# Patient Record
Sex: Male | Born: 1990 | Race: White | Hispanic: No | Marital: Single | Smoking: Never smoker
Health system: Southern US, Community
[De-identification: ages and names within clinical notes are randomized; demographics above are authoritative.]

## PROBLEM LIST (undated history)

## (undated) DIAGNOSIS — J45909 Unspecified asthma, uncomplicated: Secondary | ICD-10-CM

## (undated) DIAGNOSIS — J449 Chronic obstructive pulmonary disease, unspecified: Secondary | ICD-10-CM

## (undated) DIAGNOSIS — M419 Scoliosis, unspecified: Secondary | ICD-10-CM

## (undated) DIAGNOSIS — S62339A Displaced fracture of neck of unspecified metacarpal bone, initial encounter for closed fracture: Secondary | ICD-10-CM

## (undated) DIAGNOSIS — R011 Cardiac murmur, unspecified: Secondary | ICD-10-CM

## (undated) DIAGNOSIS — D649 Anemia, unspecified: Secondary | ICD-10-CM

## (undated) HISTORY — DX: Chronic obstructive pulmonary disease, unspecified: J44.9

## (undated) HISTORY — DX: Unspecified asthma, uncomplicated: J45.909

## (undated) HISTORY — PX: FOOT SURGERY: SHX648

## (undated) HISTORY — PX: LUNG SURGERY: SHX703

## (undated) HISTORY — DX: Anemia, unspecified: D64.9

---
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
## 2017-05-18 ENCOUNTER — Encounter (HOSPITAL_COMMUNITY): Payer: Self-pay | Admitting: Family Medicine

## 2017-05-18 DIAGNOSIS — R531 Weakness: Secondary | ICD-10-CM | POA: Insufficient documentation

## 2017-05-18 DIAGNOSIS — F1210 Cannabis abuse, uncomplicated: Secondary | ICD-10-CM | POA: Insufficient documentation

## 2017-05-18 DIAGNOSIS — G5601 Carpal tunnel syndrome, right upper limb: Secondary | ICD-10-CM | POA: Insufficient documentation

## 2017-05-18 DIAGNOSIS — R200 Anesthesia of skin: Secondary | ICD-10-CM | POA: Insufficient documentation

## 2017-05-18 NOTE — ED Triage Notes (Signed)
Patient reports he has been experiencing right numbness starting in his finger tips that radiates to his elbow/shoulder. Symptoms started 3 weeks ago but got worse in the last 2 days. Patient was [facility] about 1-2 weeks ago for same symptoms. Strong radial pulse and able to move fingers on the right hand.

## 2017-05-19 ENCOUNTER — Emergency Department (HOSPITAL_COMMUNITY)
Admission: EM | Admit: 2017-05-19 | Discharge: 2017-05-19 | Disposition: A | Discharge status: Home / Self Care | Payer: Self-pay | Attending: Emergency Medicine | Admitting: Emergency Medicine

## 2017-05-19 DIAGNOSIS — G5601 Carpal tunnel syndrome, right upper limb: Secondary | ICD-10-CM

## 2017-05-19 HISTORY — DX: Cardiac murmur, unspecified: R01.1

## 2017-05-19 HISTORY — DX: Scoliosis, unspecified: M41.9

## 2017-05-19 HISTORY — DX: Displaced fracture of neck of unspecified metacarpal bone, initial encounter for closed fracture: S62.339A

## 2017-05-19 MED ORDER — IBUPROFEN 600 MG PO TABS: 600.0000 mg | ORAL_TABLET | Freq: Four times a day (QID) | ORAL | 0 refills | Status: DC | PRN

## 2017-05-19 NOTE — ED Provider Notes (Signed)
[facility]-EMERGENCY DEPT Provider Note   CSN: [id number] Arrival date & time: 05/18/17  2147     History   Chief Complaint Chief Complaint  Patient presents with  . Numbness    HPI Ivan Lowe is a 27 y.o. male.  Patient presents to the ED with a chief complaint of right hand pain.  He states that he has had numbness and weakness in his right hand that has been gradually worsening for the past several weeks.  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  He denies any other symptoms.  The history is provided by the patient. No language interpreter was used.    Past Medical History:  Diagnosis Date  . Boxer's fracture   . Heart murmur   . Scoliosis     There are no active problems to display for this patient.   Past Surgical History:  Procedure Laterality Date  . FOOT SURGERY          Home Medications    Prior to Admission medications   Medication Sig Start Date End Date Taking? Authorizing Provider  ibuprofen (ADVIL,MOTRIN) 600 MG tablet Take 1 tablet (600 mg total) by mouth every 6 (six) hours as needed. 05/19/17   Roxy Horseman, PA-C    Family History History reviewed. No pertinent family history.  Social History Social History   Tobacco Use  . Smoking status: Never Smoker  . Smokeless tobacco: Never Used  Substance Use Topics  . Alcohol use: Yes    Comment: Once or twice a month   . Drug use: Yes    Types: Marijuana    Comment: Last used: 2 weeks ago      Allergies   Flonase [fluticasone propionate]   Review of Systems Review of Systems  All other systems reviewed and are negative.    Physical Exam Updated Vital Signs BP (!) 153/114   Pulse 79   Temp 98.2 F (36.8 C) (Oral)   Resp 17   Ht 5\' 7"  (1.702 m)   Wt 97.1 kg (214 lb)   SpO2 99%   BMI 33.52 kg/m   Physical Exam Nursing note and vitals  reviewed.  Constitutional: Pt appears well-developed and well-nourished. No distress.  HENT:  Head: Normocephalic and atraumatic.  Eyes: Conjunctivae are normal.  Neck: Normal range of motion.  Cardiovascular: Normal rate, regular rhythm. Intact distal pulses.   Capillary refill < 3 sec.  Pulmonary/Chest: Effort normal and breath sounds normal.  Musculoskeletal:  Right wrist Pt exhibits positive tinel and phalen tests, no bony abnormality or deformity.   ROM: 5/5  Strength: 4/5  Neurological: Pt  is alert. Coordination normal.  Sensation: 5/5 Skin: Skin is warm and dry. Pt is not diaphoretic.  No evidence of open wound or skin tenting Psychiatric: Pt has a normal mood and affect.     ED Treatments / Results  Labs (all labs ordered are listed, but only abnormal results are displayed) Labs Reviewed - No data to display  EKG None  Radiology No results found.  Procedures Procedures (including critical care time)  Medications Ordered in ED Medications - No data to display   Initial Impression / Assessment and Plan / ED Course  I have reviewed the triage vital signs and the nursing notes.  Pertinent labs & imaging results that were available during my care of the patient were reviewed by me and considered  in my medical decision making (see chart for details).     Patient with hx and physical consistent with carpal tunnels syndrome.  Will give velcro cock up splint and recommend hand follow-up.  Final Clinical Impressions(s) / ED Diagnoses   Final diagnoses:  Carpal tunnel syndrome of right wrist    ED Discharge Orders        Ordered    ibuprofen (ADVIL,MOTRIN) 600 MG tablet  Every 6 hours PRN     05/19/17 0401       Roxy HorsemanBrowning, Leston Schueller, PA-C 05/19/17 0410    Dione BoozeGlick, David, MD 05/19/17 [phone]

## 2019-08-30 ENCOUNTER — Encounter: Payer: Self-pay | Admitting: Cardiology

## 2019-08-30 ENCOUNTER — Ambulatory Visit (INDEPENDENT_AMBULATORY_CARE_PROVIDER_SITE_OTHER): Payer: BLUE CROSS/BLUE SHIELD | Admitting: Cardiology

## 2019-08-30 ENCOUNTER — Ambulatory Visit (INDEPENDENT_AMBULATORY_CARE_PROVIDER_SITE_OTHER): Payer: BLUE CROSS/BLUE SHIELD

## 2019-08-30 ENCOUNTER — Other Ambulatory Visit: Payer: Self-pay

## 2019-08-30 VITALS — BP 126/88 | HR 86 | Ht 67.0 in | Wt 220.0 lb

## 2019-08-30 DIAGNOSIS — E669 Obesity, unspecified: Secondary | ICD-10-CM

## 2019-08-30 DIAGNOSIS — R0602 Shortness of breath: Secondary | ICD-10-CM

## 2019-08-30 DIAGNOSIS — R55 Syncope and collapse: Secondary | ICD-10-CM

## 2019-08-30 DIAGNOSIS — G43109 Migraine with aura, not intractable, without status migrainosus: Secondary | ICD-10-CM

## 2019-08-30 DIAGNOSIS — E782 Mixed hyperlipidemia: Secondary | ICD-10-CM

## 2019-08-30 DIAGNOSIS — R002 Palpitations: Secondary | ICD-10-CM

## 2019-08-30 HISTORY — DX: Syncope and collapse: R55

## 2019-08-30 HISTORY — DX: Shortness of breath: R06.02

## 2019-08-30 HISTORY — DX: Palpitations: R00.2

## 2019-08-30 HISTORY — DX: Mixed hyperlipidemia: E78.2

## 2019-08-30 HISTORY — DX: Migraine with aura, not intractable, without status migrainosus: G43.109

## 2019-08-30 HISTORY — DX: Obesity, unspecified: E66.9

## 2019-08-30 NOTE — Patient Instructions (Signed)
Medication Instructions:  No medication changes. *If you need a refill on your cardiac medications before your next appointment, please call your pharmacy*   Lab Work: None ordered If you have labs (blood work) drawn today and your tests are completely normal, you will receive your results only by: Marland Kitchen MyChart Message (if you have MyChart) OR . A paper copy in the mail If you have any lab test that is abnormal or we need to change your treatment, we will call you to review the results.   Testing/Procedures: Your physician has requested that you have an echocardiogram. Echocardiography is a painless test that uses sound waves to create images of your heart. It provides your doctor with information about the size and shape of your heart and how well your heart's chambers and valves are working. This procedure takes approximately one hour. There are no restrictions for this procedure.   WHY IS MY DOCTOR PRESCRIBING ZIO? The Zio system is proven and trusted by physicians to detect and diagnose irregular heart rhythms -- and has been prescribed to hundreds of thousands of patients.  The FDA has cleared the Zio system to monitor for many different kinds of irregular heart rhythms. In a study, physicians were able to reach a diagnosis 90% of the time with the Zio system1.  You can wear the Zio monitor -- a small, discreet, comfortable patch -- during your normal day-to-day activity, including while you sleep, shower, and exercise, while it records every single heartbeat for analysis.  1Barrett, P., et al. Comparison of 24 Hour Holter Monitoring Versus 14 Day Novel Adhesive Patch Electrocardiographic Monitoring. American Journal of Medicine, 2014.  ZIO VS. HOLTER MONITORING The Zio monitor can be comfortably worn for up to 14 days. Holter monitors can be worn for 24 to 48 hours, limiting the time to record any irregular heart rhythms you may have. Zio is able to capture data for the 51% of patients  who have their first symptom-triggered arrhythmia after 48 hours.1  LIVE WITHOUT RESTRICTIONS The Zio ambulatory cardiac monitor is a small, unobtrusive, and water-resistant patch--you might even forget you're wearing it. The Zio monitor records and stores every beat of your heart, whether you're sleeping, working out, or showering.  Wear the monitor for 2 weeks, remove on 09/13/19.   Follow-Up: At [facility], you and your health needs are our priority.  As part of our continuing mission to provide you with exceptional heart care, we have created designated Provider Care Teams.  These Care Teams include your primary Cardiologist (physician) and Advanced Practice Providers (APPs -  Physician Assistants and Nurse Practitioners) who all work together to provide you with the care you need, when you need it.  We recommend signing up for the patient portal called "MyChart".  Sign up information is provided on this After Visit Summary.  MyChart is used to connect with patients for Virtual Visits (Telemedicine).  Patients are able to view lab/test results, encounter notes, upcoming appointments, etc.  Non-urgent messages can be sent to your provider as well.   To learn more about what you can do with MyChart, go to [email or web address].    Your next appointment:   3 month(s)  The format for your next appointment:   In Person  Provider:   Thomasene Ripple, DO   Other Instructions We have placed a referral to neurology and pulmonology. Echocardiogram An echocardiogram is a procedure that uses painless sound waves (ultrasound) to produce an image of the heart. Images from an  echocardiogram can provide important information about:  Signs of coronary artery disease (CAD).  Aneurysm detection. An aneurysm is a weak or damaged part of an artery wall that bulges out from the normal force of blood pumping through the body.  Heart size and shape. Changes in the size or shape of the heart can be  associated with certain conditions, including heart failure, aneurysm, and CAD.  Heart muscle function.  Heart valve function.  Signs of a past heart attack.  Fluid buildup around the heart.  Thickening of the heart muscle.  A tumor or infectious growth around the heart valves. Tell a health care provider about:  Any allergies you have.  All medicines you are taking, including vitamins, herbs, eye drops, creams, and over-the-counter medicines.  Any blood disorders you have.  Any surgeries you have had.  Any medical conditions you have.  Whether you are pregnant or may be pregnant. What are the risks? Generally, this is a safe procedure. However, problems may occur, including:  Allergic reaction to dye (contrast) that may be used during the procedure. What happens before the procedure? No specific preparation is needed. You may eat and drink normally. What happens during the procedure?   An IV tube may be inserted into one of your veins.  You may receive contrast through this tube. A contrast is an injection that improves the quality of the pictures from your heart.  A gel will be applied to your chest.  A wand-like tool (transducer) will be moved over your chest. The gel will help to transmit the sound waves from the transducer.  The sound waves will harmlessly bounce off of your heart to allow the heart images to be captured in real-time motion. The images will be recorded on a computer. The procedure may vary among health care providers and hospitals. What happens after the procedure?  You may return to your normal, everyday life, including diet, activities, and medicines, unless your health care provider tells you not to do that. Summary  An echocardiogram is a procedure that uses painless sound waves (ultrasound) to produce an image of the heart.  Images from an echocardiogram can provide important information about the size and shape of your heart, heart  muscle function, heart valve function, and fluid buildup around your heart.  You do not need to do anything to prepare before this procedure. You may eat and drink normally.  After the echocardiogram is completed, you may return to your normal, everyday life, unless your health care provider tells you not to do that. This information is not intended to replace advice given to you by your health care provider. Make sure you discuss any questions you have with your health care provider. Document Revised: 05/25/2018 Document Reviewed: 03/06/2016 Elsevier Patient Education  2020 [employer].

## 2019-08-30 NOTE — Progress Notes (Signed)
Cardiology Office Note:    Date:  08/30/2019   ID:  Mayford Knife, DOB [birth year], MRN [id number]  PCP:  Reita May, NP  Cardiologist:  No primary care provider on file.  Electrophysiologist:  None   Referring MD: Reita May, NP    " I have had some passion out spells and shortness of breath"  History of Present Illness:    Ivan Lowe is a 29 y.o. 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    The patient tells me recently he has been experiencing multiple episodes of syncope events.  He described it as sometimes when he is standing or sitting doing his activity he experienced significant blurry vision with lightheadedness.  Then he gets extreme headache and become sweaty.  He notes that he has to gradually sit down or else he feels that he is going to pass out.  Once the headaches come on denies for close to 15 to 20 minutes.  At times he has to turn out the lights in his apartment or in his room and lay down quietly without any noise eventually this feels better.  In addition he does have some shortness of breath.  With the shortness of breath he feels that he gets tightening of his chest.  He does have known asthma he is not taking any inhalers as well.  Given his symptoms he was asked to see cardiology.  He tells me nothing makes this better or worse.    Patient tells me that his biggest fear is that his health is going to deteriorate as young as he is and he is not going to be able to do his activities including work.  In addition he notes that he gets some palpitations which he describes abrupt onset of fast heartbeat which last for few minutes prior to resolution.  Past Medical History:  Diagnosis Date  . Boxer's fracture   . Heart murmur   . Scoliosis     Past  Surgical History:  Procedure Laterality Date  . FOOT SURGERY      Current Medications: Current Meds  Medication Sig  . celecoxib (CELEBREX) 200 MG capsule Take 200 mg by mouth as needed.  Marland Kitchen ibuprofen (ADVIL,MOTRIN) 600 MG tablet Take 1 tablet (600 mg total) by mouth every 6 (six) hours as needed.  . metaxalone (SKELAXIN) 800 MG tablet Take 800 mg by mouth 3 (three) times daily as needed.  . promethazine (PHENERGAN) 25 MG tablet Take 25 mg by mouth every 6 (six) hours as needed for nausea or vomiting.  . SUMAtriptan (IMITREX) 50 MG tablet Take 50 mg by mouth every 2 (two) hours as needed for migraine. May repeat in 2 hours if headache persists or recurs.     Allergies:   Flonase [fluticasone propionate]   Social History   Socioeconomic History  . Marital status: Single    Spouse name: Not on file  . Number of children: Not on file  . Years of education: Not on file  . Highest education level: Not on file  Occupational History  . Not on file  Tobacco Use  . Smoking status: Never Smoker  . Smokeless tobacco: Never Used  Vaping Use  . Vaping Use: Never used  Substance and Sexual Activity  . Alcohol use: Yes  Comment: Once or twice a month   . Drug use: Yes    Types: Marijuana    Comment: Last used: 2 weeks ago   . Sexual activity: Not on file  Other Topics Concern  . Not on file  Social History Narrative  . Not on file   Social Determinants of Health   Financial Resource Strain:   . Difficulty of Paying Living Expenses:   Food Insecurity:   . Worried About Charity fundraiser in the Last Year:   . Arboriculturist in the Last Year:   Transportation Needs:   . Film/video editor (Medical):   Marland Kitchen Lack of Transportation (Non-Medical):   Physical Activity:   . Days of Exercise per Week:   . Minutes of Exercise per Session:   Stress:   . Feeling of Stress :   Social Connections:   . Frequency of Communication with Friends and Family:   . Frequency of Social  Gatherings with Friends and Family:   . Attends Religious Services:   . Active Member of Clubs or Organizations:   . Attends Archivist Meetings:   Marland Kitchen Marital Status:      Family History: The patient's family history includes Heart disease in his maternal grandmother and mother; Hypertension in his mother.  ROS:   Review of Systems  Constitution: Negative for decreased appetite, fever and weight gain.  HENT: Negative for congestion, ear discharge, hoarse voice and sore throat.   Eyes: Negative for discharge, redness, vision loss in right eye and visual halos.  Cardiovascular: Reports chest tightness, shortness of breath and palpitations.  Negative for leg swelling, orthopnea. Respiratory: Negative for cough, hemoptysis, shortness of breath and snoring.   Endocrine: Negative for heat intolerance and polyphagia.  Hematologic/Lymphatic: Negative for bleeding problem. Does not bruise/bleed easily.  Skin: Negative for flushing, nail changes, rash and suspicious lesions.  Musculoskeletal: Negative for arthritis, joint pain, muscle cramps, myalgias, neck pain and stiffness.  Gastrointestinal: Negative for abdominal pain, bowel incontinence, diarrhea and excessive appetite.  Genitourinary: Negative for decreased libido, genital sores and incomplete emptying.  Neurological: Negative for brief paralysis, focal weakness, headaches and loss of balance.  Psychiatric/Behavioral: Negative for altered mental status, depression and suicidal ideas.  Allergic/Immunologic: Negative for HIV exposure and persistent infections.    EKGs/Labs/Other Studies Reviewed:    The following studies were reviewed today:   EKG:  The ekg ordered today demonstrates sinus rhythm, heart rate 75 bpm nonspecific ST changes LVH by aVL criteria.  Recent Labs: Labs done on August 22, 2019 by his PCP: WBC 8.0, hemoglobin 16.1, hematocrit 48.6, platelet 354 Chemistry: Glucose 101, BUN 9, creatinine 1.10, sodium 140,  potassium 4.5, chloride 102, bicarb 25, calcium 10.3, total protein 7.2, albumin 4.5, total globulin 2.7, total bili 0.3, alk phos 71, AST 13, ALT 20 TSH 1.66 Recent Lipid Panel Lipid profile done in March 2021 total cholesterol 207, triglyceride 102, HDL 49, LDL 140  Physical Exam:    VS:  BP 126/88 (BP Location: Left Arm, Patient Position: Sitting, Cuff Size: Normal)   Pulse 86   Ht '5\' 7"'  (1.702 m)   Wt 220 lb (99.8 kg)   SpO2 98%   BMI 34.46 kg/m     Wt Readings from Last 3 Encounters:  08/30/19 220 lb (99.8 kg)  05/18/17 214 lb (97.1 kg)     GEN: Well nourished, well developed in no acute distress HEENT: Normal NECK: No JVD; No carotid bruits LYMPHATICS: No  lymphadenopathy CARDIAC: S1S2 noted,RRR, no murmurs, rubs, gallops RESPIRATORY:  Clear to auscultation without rales, wheezing or rhonchi  ABDOMEN: Soft, non-tender, non-distended, +bowel sounds, no guarding. EXTREMITIES: No edema, No cyanosis, no clubbing MUSCULOSKELETAL:  No deformity  SKIN: Warm and dry NEUROLOGIC:  Alert and oriented x 3, non-focal PSYCHIATRIC:  Normal affect, good insight  ASSESSMENT:    1. Syncope and collapse   2. Palpitations   3. Shortness of breath   4. Complicated migraine   5. Mixed hyperlipidemia   6. Obesity (BMI 30-39.9)    PLAN:    His symptoms are concerning.  I would like to rule out a cardiovascular etiology of this presyncope and palpitation, therefore at this time I would like to placed a zio patch for 14 days. In additon a transthoracic echocardiogram will be ordered to assess LV/RV function and any structural abnormalities.  I will have his echocardiogram be with bubble study given the concern for complicated migraine to make sure that there is no PFO.  I do think that complicated migraine headaches may be an issue here.  I have also asked the patient to see neurology to hopefully help him with medical treatment for this.  Given his significant long history with lung  surgery as a child, asthma that is not being treated and his recent shortness of breath that is worsening it is appropriate for the patient to see pulmonary to at least get a pulmonary function tests and hopefully get back on a rescue inhaler.  Obesity-the patient understands the need to lose weight with diet and exercise. We have discussed specific strategies for this.   Hyperlipidemia recent LDL was 140.  Patient prefers diet modification for now.  I did discuss the [place] DMV medical guidelines for driving: "it is prudent to recommend that all persons should be free of syncopal episodes for at least six months to be granted the driving privilege." ([place], Second Edition, Medical Review Branch, Engineer, site, Division of [employer], [employer], July 2004)  Once these testing have been performed and reviewed further reccomendations will be made. For now, I do reccomend that the patient goes to the nearest ED if  symptoms recur.   The patient is in agreement with the above plan. The patient left the office in stable condition.  The patient will follow up in 3 months or sooner if needed.   Medication Adjustments/Labs and Tests Ordered: Current medicines are reviewed at length with the patient today.  Concerns regarding medicines are outlined above.  Orders Placed This Encounter  Procedures  . Ambulatory referral to Pulmonology  . Ambulatory referral to Neurology  . LONG TERM MONITOR (3-14 DAYS)  . EKG 12-Lead  . ECHOCARDIOGRAM COMPLETE BUBBLE STUDY   No orders of the defined types were placed in this encounter.   Patient Instructions  Medication Instructions:  No medication changes. *If you need a refill on your cardiac medications before your next appointment, please call your pharmacy*   Lab Work: None ordered If you have labs (blood work) drawn today and your tests are  completely normal, you will receive your results only by: Marland Kitchen MyChart Message (if you have MyChart) OR . A paper copy in the mail If you have any lab test that is abnormal or we need to change your treatment, we will call you to review the results.   Testing/Procedures: Your physician has requested that you have an echocardiogram. Echocardiography  is a painless test that uses sound waves to create images of your heart. It provides your doctor with information about the size and shape of your heart and how well your heart's chambers and valves are working. This procedure takes approximately one hour. There are no restrictions for this procedure.   WHY IS MY DOCTOR PRESCRIBING ZIO? The Zio system is proven and trusted by physicians to detect and diagnose irregular heart rhythms -- and has been prescribed to hundreds of thousands of patients.  The FDA has cleared the Zio system to monitor for many different kinds of irregular heart rhythms. In a study, physicians were able to reach a diagnosis 90% of the time with the Zio system1.  You can wear the Zio monitor -- a small, discreet, comfortable patch -- during your normal day-to-day activity, including while you sleep, shower, and exercise, while it records every single heartbeat for analysis.  1Barrett, P., et al. Comparison of 24 Hour Holter Monitoring Versus 14 Day Novel Adhesive Patch Electrocardiographic Monitoring. [place], 2014.  ZIO VS. HOLTER MONITORING The Zio monitor can be comfortably worn for up to 14 days. Holter monitors can be worn for 24 to 48 hours, limiting the time to record any irregular heart rhythms you may have. Zio is able to capture data for the 51% of patients who have their first symptom-triggered arrhythmia after 48 hours.1  LIVE WITHOUT RESTRICTIONS The Zio ambulatory cardiac monitor is a small, unobtrusive, and water-resistant patch--you might even forget you're wearing it. The Zio monitor records  and stores every beat of your heart, whether you're sleeping, working out, or showering.  Wear the monitor for 2 weeks, remove on 09/13/19.   Follow-Up: At [facility], you and your health needs are our priority.  As part of our continuing mission to provide you with exceptional heart care, we have created designated Provider Care Teams.  These Care Teams include your primary Cardiologist (physician) and Advanced Practice Providers (APPs -  Physician Assistants and Nurse Practitioners) who all work together to provide you with the care you need, when you need it.  We recommend signing up for the patient portal called "MyChart".  Sign up information is provided on this After Visit Summary.  MyChart is used to connect with patients for Virtual Visits (Telemedicine).  Patients are able to view lab/test results, encounter notes, upcoming appointments, etc.  Non-urgent messages can be sent to your provider as well.   To learn more about what you can do with MyChart, go to [email or web address].    Your next appointment:   3 month(s)  The format for your next appointment:   In Person  Provider:   Berniece Salines, DO   Other Instructions We have placed a referral to neurology and pulmonology. Echocardiogram An echocardiogram is a procedure that uses painless sound waves (ultrasound) to produce an image of the heart. Images from an echocardiogram can provide important information about:  Signs of coronary artery disease (CAD).  Aneurysm detection. An aneurysm is a weak or damaged part of an artery wall that bulges out from the normal force of blood pumping through the body.  Heart size and shape. Changes in the size or shape of the heart can be associated with certain conditions, including heart failure, aneurysm, and CAD.  Heart muscle function.  Heart valve function.  Signs of a past heart attack.  Fluid buildup around the heart.  Thickening of the heart muscle.  A tumor or  infectious growth around  the heart valves. Tell a health care provider about:  Any allergies you have.  All medicines you are taking, including vitamins, herbs, eye drops, creams, and over-the-counter medicines.  Any blood disorders you have.  Any surgeries you have had.  Any medical conditions you have.  Whether you are pregnant or may be pregnant. What are the risks? Generally, this is a safe procedure. However, problems may occur, including:  Allergic reaction to dye (contrast) that may be used during the procedure. What happens before the procedure? No specific preparation is needed. You may eat and drink normally. What happens during the procedure?   An IV tube may be inserted into one of your veins.  You may receive contrast through this tube. A contrast is an injection that improves the quality of the pictures from your heart.  A gel will be applied to your chest.  A wand-like tool (transducer) will be moved over your chest. The gel will help to transmit the sound waves from the transducer.  The sound waves will harmlessly bounce off of your heart to allow the heart images to be captured in real-time motion. The images will be recorded on a computer. The procedure may vary among health care providers and hospitals. What happens after the procedure?  You may return to your normal, everyday life, including diet, activities, and medicines, unless your health care provider tells you not to do that. Summary  An echocardiogram is a procedure that uses painless sound waves (ultrasound) to produce an image of the heart.  Images from an echocardiogram can provide important information about the size and shape of your heart, heart muscle function, heart valve function, and fluid buildup around your heart.  You do not need to do anything to prepare before this procedure. You may eat and drink normally.  After the echocardiogram is completed, you may return to your normal,  everyday life, unless your health care provider tells you not to do that. This information is not intended to replace advice given to you by your health care provider. Make sure you discuss any questions you have with your health care provider. Document Revised: 05/25/2018 Document Reviewed: 03/06/2016 Elsevier Patient Education  [place].      Adopting a Healthy Lifestyle.  Know what a healthy weight is for you (roughly BMI <25) and aim to maintain this   Aim for 7+ servings of fruits and vegetables daily   65-80+ fluid ounces of water or unsweet tea for healthy kidneys   Limit to max 1 drink of alcohol per day; avoid smoking/tobacco   Limit animal fats in diet for cholesterol and heart health - choose grass fed whenever available   Avoid highly processed foods, and foods high in saturated/trans fats   Aim for low stress - take time to unwind and care for your mental health   Aim for 150 min of moderate intensity exercise weekly for heart health, and weights twice weekly for bone health   Aim for 7-9 hours of sleep daily   When it comes to diets, agreement about the perfect plan isnt easy to find, even among the experts. Experts at the [place] developed an idea known as the Healthy Eating Plate. Just imagine a plate divided into logical, healthy portions.   The emphasis is on diet quality:   Load up on vegetables and fruits - one-half of your plate: Aim for color and variety, and remember that potatoes dont count.   Go  for whole grains - one-quarter of your plate: Whole wheat, barley, wheat berries, quinoa, oats, brown rice, and foods made with them. If you want pasta, go with whole wheat pasta.   Protein power - one-quarter of your plate: Fish, chicken, beans, and nuts are all healthy, versatile protein sources. Limit red meat.   The diet, however, does go beyond the plate, offering a few other suggestions.   Use healthy plant oils, such  as olive, canola, soy, corn, sunflower and peanut. Check the labels, and avoid partially hydrogenated oil, which have unhealthy trans fats.   If youre thirsty, drink water. Coffee and tea are good in moderation, but skip sugary drinks and limit milk and dairy products to one or two daily servings.   The type of carbohydrate in the diet is more important than the amount. Some sources of carbohydrates, such as vegetables, fruits, whole grains, and beans-are healthier than others.   Finally, stay active  Signed, Berniece Salines, DO  08/30/2019 10:12 AM    [place]

## 2019-09-03 ENCOUNTER — Encounter: Payer: Self-pay | Admitting: Neurology

## 2019-09-20 ENCOUNTER — Ambulatory Visit (INDEPENDENT_AMBULATORY_CARE_PROVIDER_SITE_OTHER): Payer: BLUE CROSS/BLUE SHIELD

## 2019-09-20 ENCOUNTER — Other Ambulatory Visit: Payer: Self-pay

## 2019-09-20 DIAGNOSIS — G43109 Migraine with aura, not intractable, without status migrainosus: Secondary | ICD-10-CM | POA: Diagnosis not present

## 2019-09-20 DIAGNOSIS — R0602 Shortness of breath: Secondary | ICD-10-CM

## 2019-09-20 LAB — ECHOCARDIOGRAM COMPLETE BUBBLE STUDY
Area-P 1/2: 4.89 cm2
S' Lateral: 3.2 cm

## 2019-09-20 NOTE — Progress Notes (Addendum)
Complete echocardiogram with bubble study performed.  Jimmy Joahan Swatzell RDCS, RVT  

## 2019-09-21 ENCOUNTER — Telehealth: Payer: Self-pay

## 2019-09-21 NOTE — Telephone Encounter (Signed)
Patient is returning call.

## 2019-09-21 NOTE — Telephone Encounter (Signed)
-----   Message from Thomasene Ripple, DO sent at 09/21/2019  8:16 AM EDT ----- Good news normal echo

## 2019-09-21 NOTE — Telephone Encounter (Signed)
Pt aware ./cy 

## 2019-09-21 NOTE — Telephone Encounter (Signed)
Tried calling patient. No answer and no voicemail set up for me to leave a message. 

## 2019-10-10 ENCOUNTER — Telehealth: Payer: Self-pay | Admitting: Cardiology

## 2019-10-10 DIAGNOSIS — R55 Syncope and collapse: Secondary | ICD-10-CM

## 2019-10-10 NOTE — Telephone Encounter (Signed)
    Pt calling back to follow up Dr. Mallory Shirk referral to see DR. Camnitz. He said he spoke with Dr. Servando Salina on 08/21 to give his heart monitor result but he not heard anything back from it. No referral on file yet on file.

## 2019-10-15 ENCOUNTER — Encounter: Payer: Self-pay | Admitting: Cardiology

## 2019-10-15 ENCOUNTER — Ambulatory Visit (INDEPENDENT_AMBULATORY_CARE_PROVIDER_SITE_OTHER): Payer: BLUE CROSS/BLUE SHIELD | Admitting: Cardiology

## 2019-10-15 ENCOUNTER — Other Ambulatory Visit: Payer: Self-pay

## 2019-10-15 VITALS — BP 116/90 | HR 77 | Ht 67.0 in | Wt 223.0 lb

## 2019-10-15 DIAGNOSIS — R55 Syncope and collapse: Secondary | ICD-10-CM

## 2019-10-15 NOTE — Patient Instructions (Signed)
Medication Instructions:  Your physician recommends that you continue on your current medications as directed. Please refer to the Current Medication list given to you today.  *If you need a refill on your cardiac medications before your next appointment, please call your pharmacy*   Lab Work: None ordered If you have labs (blood work) drawn today and your tests are completely normal, you will receive your results only by: Marland Kitchen MyChart Message (if you have MyChart) OR . A paper copy in the mail If you have any lab test that is abnormal or we need to change your treatment, we will call you to review the results.   Testing/Procedures: None ordered   Follow-Up: At [facility], you and your health needs are our priority.  As part of our continuing mission to provide you with exceptional heart care, we have created designated Provider Care Teams.  These Care Teams include your primary Cardiologist (physician) and Advanced Practice Providers (APPs -  Physician Assistants and Nurse Practitioners) who all work together to provide you with the care you need, when you need it.  We recommend signing up for the patient portal called "MyChart".  Sign up information is provided on this After Visit Summary.  MyChart is used to connect with patients for Virtual Visits (Telemedicine).  Patients are able to view lab/test results, encounter notes, upcoming appointments, etc.  Non-urgent messages can be sent to your provider as well.   To learn more about what you can do with MyChart, go to [email or web address].    Your next appointment:    As needed  The format for your next appointment:   In Person  Provider:   Loman Brooklyn, MD   Thank you for choosing [facility]!!   Dory Horn, RN [phone]    Other Instructions

## 2019-10-15 NOTE — Progress Notes (Signed)
Electrophysiology Office Note   Date:  10/15/2019   ID:  [name], [name] 04/10/1990, MRN [id number]  PCP:  Yvonne Kendall, NP  Cardiologist:  Servando Salina Primary Electrophysiologist:  Amyah Clawson Jorja Loa, MD    Chief Complaint: palpitations   History of Present Illness: Ivan Lowe is a 29 y.o. male who is being seen today for the evaluation of palpitations at the request of Tobb, Kardie, DO. Presenting today for electrophysiology evaluation.  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  He has been having multiple episodes of syncope.  These mostly occur when standing or sitting.  He gets blurry vision with lightheadedness.  He also has an extreme headache and gets sweaty.  He has to gradually sit down or else he feels that he Jeanette Moffatt pass out.  This started a few weeks ago.  Prior to that he had done well.  He does work in Scientist, water quality, but he says that it is quite well ventilated.  Today, he denies symptoms of palpitations, chest pain, shortness of breath, orthopnea, PND, lower extremity edema, claudication, dizziness, presyncope, syncope, bleeding, or neurologic sequela. The patient is tolerating medications without difficulties.    Past Medical History:  Diagnosis Date  . Boxer's fracture   . Heart murmur   . Scoliosis    Past Surgical History:  Procedure Laterality Date  . FOOT SURGERY       Current Outpatient Medications  Medication Sig Dispense Refill  . celecoxib (CELEBREX) 200 MG capsule Take 200 mg by mouth as needed.    Marland Kitchen ibuprofen (ADVIL,MOTRIN) 600 MG tablet Take 1 tablet (600 mg total) by mouth every 6 (six) hours as needed. 30 tablet 0  . SUMAtriptan (IMITREX) 50 MG tablet Take 50 mg by mouth every 2 (two) hours as needed for migraine. May repeat in 2 hours if headache persists or recurs.     No current facility-administered medications for this visit.     Allergies:   Flonase [fluticasone propionate]   Social History:  The patient  reports that he has never smoked. He has never used smokeless tobacco. He reports current alcohol use. He reports current drug use. Drug: Marijuana.   Family History:  The patient's family history includes Heart disease in his maternal grandmother and mother; Hypertension in his mother.    ROS:  Please see the history of present illness.   Otherwise, review of systems is positive for none.   All other systems are reviewed and negative.    PHYSICAL EXAM: VS:  BP 116/90   Pulse 77   Ht 5\' 7"  (1.702 m)   Wt 223 lb (101.2 kg)   BMI 34.93 kg/m  , BMI Body mass index is 34.93 kg/m. GEN: Well nourished, well developed, in no acute distress  HEENT: normal  Neck: no JVD, carotid bruits, or masses Cardiac: RRR; no murmurs, rubs, or gallops,no edema  Respiratory:  clear to auscultation bilaterally, normal work of breathing GI: soft, nontender, nondistended, + BS MS: no deformity or atrophy  Skin: warm and dry Neuro:  Strength and sensation are intact Psych: euthymic mood, full affect  EKG:  EKG is ordered today. Personal review of the ekg ordered shows SR, rate 77  Recent Labs: No results found for requested labs within last 8760 hours.    Lipid Panel  No results found for: CHOL, TRIG, HDL, CHOLHDL, VLDL, LDLCALC, LDLDIRECT   Wt  Readings from Last 3 Encounters:  10/15/19 223 lb (101.2 kg)  08/30/19 220 lb (99.8 kg)  05/18/17 214 lb (97.1 kg)      Other studies Reviewed: Additional studies/ records that were reviewed today include: TTE 09/20/19  Review of the above records today demonstrates:  1. Left ventricular ejection fraction, by estimation, is 60 to 65%. The  left ventricle has normal function. The left ventricle has no regional  wall motion abnormalities. There is mild concentric left ventricular  hypertrophy. Left ventricular diastolic  parameters were normal.  2. Right ventricular  systolic function is normal. The right ventricular  size is normal. There is normal pulmonary artery systolic pressure.  3. The mitral valve is normal in structure. No evidence of mitral valve  regurgitation. No evidence of mitral stenosis.  4. The aortic valve is tricuspid. Aortic valve regurgitation is not  visualized. No aortic stenosis is present.  5. The inferior vena cava is normal in size with greater than 50%  respiratory variability, suggesting right atrial pressure of 3 mmHg.  6. Agitated saline contrast bubble study was negative, with no evidence  of any interatrial shunt.   Monitor 10/06/19 personally reviewed The minimum heart rate was 39 bpm, maximum heart rate was 165  bpm, and average heart rate was 80 bpm. Predominant underlying rhythm was Sinus Rhythm.   32 Pauses occurred, the longest lasting 3.6 secs (17 bpm). Junctional Rhythm was present  Premature atrial complexes were rare less than 1%. Premature Ventricular complexes rare less than 1%.  No ventricular tachycardia, No AV block, no supraventricular tachycardia and no atrial fibrillation present.   6 patient triggered events  Associated with sinus rhythm and 1 associated with junctional rhythm.  ASSESSMENT AND PLAN:  1.  Syncope: At this point it does not appear that his episodes of syncope are cardiac related.  He did wear a cardiac monitor and each time he triggered, he was in sinus rhythm to sinus tachycardia.  He did have some junctional rhythm with some pauses of up to 3.6 seconds, but I do not feel that pacemaker is indicated as he was not symptomatic during any of these events.  I told him to continue to monitor and if this worsens to call us back.   Case discussed with primary cardiology  Current medicines are reviewed at length with the patient today.   The patient does not have concerns regarding his medicines.  The following changes were made today:  none  Labs/ tests ordered today include:    Orders Placed This Encounter  Procedures  . EKG 12-Lead     Disposition:   FU with Robert Sunga as needed  Signed, Pierra Skora Jorja Loa, MD  10/15/2019 9:47 AM     [facility] [address] [place] [address] [phone] (office) [phone] (fax)

## 2019-11-21 ENCOUNTER — Encounter: Payer: Self-pay | Admitting: Cardiology

## 2019-11-26 NOTE — Progress Notes (Signed)
NEUROLOGY CONSULTATION NOTE  BREION NOVACEK MRN: [id number] DOB: 09/14/1990  Referring provider: Thomasene Ripple, DO Primary care provider: Yvonne Kendall, NP  Reason for consult:  Complicated migraines  HISTORY OF PRESENT ILLNESS: Ivan Lowe. Wight is a 29 year old right-handed male who presents for complicated migraines.  History supplemented by referring provider's notes.  He has had recurrent syncopal events since April.  He describes feeling of diaphoresis, head pressure, chest tightness, SOB, lightheadedness and blurred vision or spots in his vision.  This usually lasts 5 to 15 minutes.  It is not positional.  He needs to sit down to prevent from passing out.  He has passed out twice.  This is followed by a severe throbbing frontal headache associated with nausea, photophobia, and phonophobia but no numbness or unilateral weakness.  Headache lasts 1 to 2 hours.  Afterwards, he has extreme fatigue and diffuse weakness, sometimes up to several days.  Blood pressure during events range from 160/94 to 273/101.  Blood sugars were normal.  No specific trigger or preceding event.  Sometimes laying down in a dark and quiet room helps relieve some symptoms.  He was evaluated by cardiology in July.   Echocardiogram in August was normal with EF 60-65% with negative bubble study.  Cardiac event monitor showed sinus to sinus tachycardia with events as well as some asymptomatic junctional rhythm with some pauses up to 3.6 seconds but no significant arrhythmia to correlate with is events.  Cardiac etiology was ruled out.    He has poor sleep with chronic insomnia.  He sleeps no more than 2 to 3 hours and sometimes goes several days without sleep.  He saw a sleep specialist once when he was 8 or 29 years old and was prescribed melatonin 30mg  which was ineffective.  He has history of migraines in childhood.  He has significant psychiatric family history.  Maybe his mother had headaches.  Current  medications:  Sumatriptan 50mg , ibuprofen   PAST MEDICAL HISTORY: Past Medical History:  Diagnosis Date  . Boxer's fracture   . Heart murmur   . Scoliosis     PAST SURGICAL HISTORY: Past Surgical History:  Procedure Laterality Date  . FOOT SURGERY      MEDICATIONS: Current Outpatient Medications on File Prior to Visit  Medication Sig Dispense Refill  . celecoxib (CELEBREX) 200 MG capsule Take 200 mg by mouth as needed.    ibuprofen (ADVIL,MOTRIN) 600 MG tablet Take 1 tablet (600 mg total) by mouth every 6 (six) hours as needed. 30 tablet 0  . SUMAtriptan (IMITREX) 50 MG tablet Take 50 mg by mouth every 2 (two) hours as needed for migraine. May repeat in 2 hours if headache persists or recurs.     No current facility-administered medications on file prior to visit.    ALLERGIES: Allergies  Allergen Reactions  . Flonase [Fluticasone Propionate] Other (See Comments)    Caused nose bleeds    FAMILY HISTORY: Family History  Problem Relation Age of Onset  . Heart disease Mother   . Hypertension Mother   . Heart disease Maternal Grandmother    SOCIAL HISTORY: Social History   Socioeconomic History  . Marital status: Single    Spouse name: Not on file  . Number of children: Not on file  . Years of education: Not on file  . Highest education level: Not on file  Occupational History  . Not on file  Tobacco Use  . Smoking status: Never Smoker  .  Smokeless tobacco: Never Used  Vaping Use  . Vaping Use: Never used  Substance and Sexual Activity  . Alcohol use: Yes    Comment: Once or twice a month   . Drug use: Yes    Types: Marijuana    Comment: Last used: 2 weeks ago   . Sexual activity: Not on file  Other Topics Concern  . Not on file  Social History Narrative  . Not on file   Social Determinants of Health   Financial Resource Strain:   . Difficulty of Paying Living Expenses: Not on file  Food Insecurity:   . Worried About Programme researcher, broadcasting/film/video in the  Last Year: Not on file  . Ran Out of Food in the Last Year: Not on file  Transportation Needs:   . Lack of Transportation (Medical): Not on file  . Lack of Transportation (Non-Medical): Not on file  Physical Activity:   . Days of Exercise per Week: Not on file  . Minutes of Exercise per Session: Not on file  Stress:   . Feeling of Stress : Not on file  Social Connections:   . Frequency of Communication with Friends and Family: Not on file  . Frequency of Social Gatherings with Friends and Family: Not on file  . Attends Religious Services: Not on file  . Active Member of Clubs or Organizations: Not on file  . Attends Banker Meetings: Not on file  . Marital Status: Not on file  Intimate Partner Violence:   . Fear of Current or Ex-Partner: Not on file  . Emotionally Abused: Not on file  . Physically Abused: Not on file  . Sexually Abused: Not on file    PHYSICAL EXAM: Blood pressure 136/81, pulse 84, height 5\' 7"  (1.702 m), weight 221 lb 6.4 oz (100.4 kg), SpO2 97 %. General: No acute distress.  Patient appears well-groomed.  Head:  Normocephalic/atraumatic Eyes:  fundi examined but not visualized Neck: supple, no paraspinal tenderness, full range of motion Back: No paraspinal tenderness Heart: regular rate and rhythm Lungs: Clear to auscultation bilaterally. Vascular: No carotid bruits. Neurological Exam: Mental status: alert and oriented to person, place, and time, recent and remote memory intact, fund of knowledge intact, attention and concentration intact, speech fluent and not dysarthric, language intact. Cranial nerves: CN I: not tested CN II: pupils equal, round and reactive to light, visual fields intact CN III, IV, VI:  full range of motion, no nystagmus, no ptosis CN V: facial sensation intact CN VII: upper and lower face symmetric CN VIII: hearing intact CN IX, X: gag intact, uvula midline CN XI: sternocleidomastoid and trapezius muscles intact CN  XII: tongue midline Bulk & Tone: normal, no fasciculations. Motor:  5/5 throughout  Sensation:  Pinprick and vibration sensation intact. Deep Tendon Reflexes:  2+ throughout, toes downgoing.  Finger to nose testing:  Without dysmetria.  Heel to shin:  Without dysmetria.  Gait:  Normal station and stride.  Able to turn and tandem walk. Romberg negative.  IMPRESSION: 1.  Recurrent near-syncope/syncope.  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  Will check MRI brain and MRA of head and neck to evaluate for intracranial abnormality or vascular/vertebrobasilar abnormality 2.  Initiate topiramate 25mg  at bedtime for one week, then increase to 50mg  at bedtime.  We can increase dose to 75mg  at bedtime in 7 weeks if needed. 3.  Triptans would be contraindicated in basilar migraine.  Therefore, I will have him stop sumatriptan and he will  try Nurtec 4.  Zofran ODT 4mg  for nausea. 5.  Limit use of pain relievers to no more than 2 days out of week to prevent risk of rebound or medication-overuse headache. 6.  Keep headache diary 7.  He is seeing pulmonology for shortness of breath.  I advised to ask them to see one of there sleep specialists regarding insomnia.  Otherwise, I can refer to [facility]. 8.  Exercise, hydration, monitor for triggers 9.  Follow up in 4 months.  Thank you for allowing me to take part in the care of this patient.  [facility], DO  CC:  Shon Millet, DO  Thomasene Ripple, NP

## 2019-11-27 ENCOUNTER — Ambulatory Visit (INDEPENDENT_AMBULATORY_CARE_PROVIDER_SITE_OTHER): Payer: BLUE CROSS/BLUE SHIELD | Admitting: Neurology

## 2019-11-27 ENCOUNTER — Other Ambulatory Visit: Payer: Self-pay

## 2019-11-27 ENCOUNTER — Encounter: Payer: Self-pay | Admitting: Neurology

## 2019-11-27 VITALS — BP 136/81 | HR 84 | Ht 67.0 in | Wt 221.4 lb

## 2019-11-27 DIAGNOSIS — R55 Syncope and collapse: Secondary | ICD-10-CM

## 2019-11-27 DIAGNOSIS — G450 Vertebro-basilar artery syndrome: Secondary | ICD-10-CM | POA: Diagnosis not present

## 2019-11-27 DIAGNOSIS — G43109 Migraine with aura, not intractable, without status migrainosus: Secondary | ICD-10-CM

## 2019-11-27 MED ORDER — ONDANSETRON 4 MG PO TBDP: 4.0000 mg | ORAL_TABLET | Freq: Three times a day (TID) | ORAL | 5 refills | Status: DC | PRN

## 2019-11-27 MED ORDER — TOPIRAMATE 25 MG PO TABS: ORAL_TABLET | ORAL | 0 refills | Status: DC

## 2019-11-27 NOTE — Patient Instructions (Addendum)
  Will check MRI of brain and MRA of head and neck. We have sent a referral to [facility] for your MRI and they will call you directly to schedule your appointment. They are located at [address] [facility]. If you need to contact them directly please call [phone]. 1.  2. Start topiramate 25mg  at bedtime for one week, then 50mg  at bedtime.  Contact in 7 weeks with update and we can increase dose if needed. 3. Take Nurtec at earliest onset of headache.  Maximum 1 tablet in 24 hours. If effective, contact me for prescription.  Stop sumatriptan. 4. Ondansetron prescribed for nausea. 5. Limit use of pain relievers to no more than 2 days out of the week.  These medications include acetaminophen, NSAIDs (ibuprofen/Advil/Motrin, naproxen/Aleve, triptans (Imitrex/sumatriptan), Excedrin, and narcotics.  This will help reduce risk of rebound headaches. 6. Be aware of common food triggers:  - Caffeine:  coffee, black tea, cola, Mt. Dew  - Chocolate  - Dairy:  aged cheeses (brie, blue, cheddar, gouda, [place], provolone, Tye, Swiss, etc), chocolate milk, buttermilk, sour cream, limit eggs and yogurt  - Nuts, peanut butter  - Alcohol  - Cereals/grains:  FRESH breads (fresh bagels, sourdough, doughnuts), yeast productions  - Processed/canned/aged/cured meats (pre-packaged deli meats, hotdogs)  - MSG/glutamate:  soy sauce, flavor enhancer, pickled/preserved/marinated foods  - Sweeteners:  aspartame (Equal, Nutrasweet).  Sugar and Splenda are okay  - Vegetables:  legumes (lima beans, lentils, snow peas, fava beans, pinto peans, peas, garbanzo beans), sauerkraut, onions, olives, pickles  - Fruit:  avocados, bananas, citrus fruit (orange, lemon, grapefruit), mango  - Other:  Frozen meals, macaroni and cheese 7. Routine exercise 8. Stay adequately hydrated (aim for 64 oz water daily) 9. Keep headache diary 10. Maintain proper stress management 11. Maintain proper sleep hygiene 12. Do not  skip meals 13. Consider supplements:  magnesium citrate 400mg  daily, riboflavin 400mg  daily, coenzyme Q10 100mg  three times daily.

## 2019-12-05 ENCOUNTER — Ambulatory Visit (INDEPENDENT_AMBULATORY_CARE_PROVIDER_SITE_OTHER): Payer: BLUE CROSS/BLUE SHIELD | Admitting: Pulmonary Disease

## 2019-12-05 ENCOUNTER — Encounter: Payer: Self-pay | Admitting: Pulmonary Disease

## 2019-12-05 ENCOUNTER — Other Ambulatory Visit: Payer: Self-pay

## 2019-12-05 VITALS — BP 102/70 | HR 80 | Temp 97.2°F | Ht 67.0 in | Wt 222.6 lb

## 2019-12-05 DIAGNOSIS — R0683 Snoring: Secondary | ICD-10-CM | POA: Diagnosis not present

## 2019-12-05 DIAGNOSIS — R0600 Dyspnea, unspecified: Secondary | ICD-10-CM | POA: Diagnosis not present

## 2019-12-05 NOTE — Patient Instructions (Addendum)
We will schedule you for a home sleep study to assess for obstructive sleep apnea We will schedule you for a sleep medicine appointment Please send your records in from [place], NP

## 2019-12-05 NOTE — Progress Notes (Signed)
Synopsis: Referred by Thomasene RippleKardie Tobb, DO for shortness of breath  Subjective:   PATIENT ID: Ivan Lowe GENDER: male DOB: 30-Apr-1990, MRN: [id number]   HPI  Chief Complaint  Patient presents with  . Consult    SOB since April. Feels light headed, chest tightness, sob, BP raises. Has passed out twice. Has history of lung problems.    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   He reports since April 2021 he has been having episodes of headache, dizziness, chest tightness, dyspnea and altered mentation with two reported episodes where he lost consciousness. He reports more recently that he has been checking his blood pressure during the onset of these symptoms which has been elevated above sBP 160 up to the low 200s. He reports his blood sugar is normal during these episodes. He also reports that he has facial flushing reported by his mother. He also feels diaphoretic. Prior to the onset of these episodes he does not have wheezing, cough, dyspnea or chest tightness. He has an albuterol inhaler which he uses sparingly.   As a child, he was on maintenance inhalers with as needed albuterol inhalers and nebulizer treatments. He has been weaned off inhaler therapy for many years now and he reports that the shortness of breath and chest tightness are different from previous asthma issues in childhood.   He reports he had some lab work done at the [facility] in [place] as they mentioned they were concerned about his adrenal glands with the blood pressure issues.   He was evaluated by cardiology in July.   Echocardiogram in August was normal with EF 60-65% with negative bubble study.  Cardiac event monitor showed sinus to sinus tachycardia with events as well as some asymptomatic junctional rhythm with some pauses up to 3.6 seconds but no significant arrhythmia to correlate  with is events.  Cardiac etiology was ruled out.   He is being evaluated by Neurology and has been started on Topiramate for the headaches and concern for possible migraines. He is schedule for head/neck MRI/MRA later this month.  He does report snoring at night. He reports trouble falling asleep and staying asleep. When he wakes in the morning, he feels delirious which can take some of the morning to clear. His mother and grandmother have sleep apnea. He does not smoke cigarettes. He reports smoking marijuana prior to April 2021 but he has not smoked since the onset of these spells.   Past Medical History:  Diagnosis Date  . Boxer's fracture   . Heart murmur   . Scoliosis      Family History  Problem Relation Age of Onset  . Heart disease Mother   . Hypertension Mother   . Heart disease Maternal Grandmother      Social History   Socioeconomic History  . Marital status: Single    Spouse name: Not on file  . Number of children: Not on file  . Years of education: Not on file  . Highest education level: Not on file  Occupational History  . Not on file  Tobacco Use  . Smoking status: Never Smoker  . Smokeless tobacco: Never Used  Vaping Use  . Vaping Use: Never used  Substance and Sexual Activity  . Alcohol use: Yes    Comment: Once or twice a month   . Drug use: Yes  Types: Marijuana    Comment: Last used: 2 weeks ago   . Sexual activity: Not on file  Other Topics Concern  . Not on file  Social History Narrative   Right handed   Social Determinants of Health   Financial Resource Strain:   . Difficulty of Paying Living Expenses: Not on file  Food Insecurity:   . Worried About Programme researcher, broadcasting/film/video in the Last Year: Not on file  . Ran Out of Food in the Last Year: Not on file  Transportation Needs:   . Lack of Transportation (Medical): Not on file  . Lack of Transportation (Non-Medical): Not on file  Physical Activity:   . Days of Exercise per Week: Not on file   . Minutes of Exercise per Session: Not on file  Stress:   . Feeling of Stress : Not on file  Social Connections:   . Frequency of Communication with Friends and Family: Not on file  . Frequency of Social Gatherings with Friends and Family: Not on file  . Attends Religious Services: Not on file  . Active Member of Clubs or Organizations: Not on file  . Attends Banker Meetings: Not on file  . Marital Status: Not on file  Intimate Partner Violence:   . Fear of Current or Ex-Partner: Not on file  . Emotionally Abused: Not on file  . Physically Abused: Not on file  . Sexually Abused: Not on file     Allergies  Allergen Reactions  . Flonase [Fluticasone Propionate] Other (See Comments)    Caused nose bleeds     Outpatient Medications Prior to Visit  Medication Sig Dispense Refill  . albuterol (VENTOLIN HFA) 108 (90 Base) MCG/ACT inhaler Inhale into the lungs.    . celecoxib (CELEBREX) 200 MG capsule Take 200 mg by mouth as needed.    . fexofenadine (ALLEGRA) 180 MG tablet Take 180 mg by mouth daily.    . ondansetron (ZOFRAN ODT) 4 MG disintegrating tablet Take 1 tablet (4 mg total) by mouth every 8 (eight) hours as needed for nausea or vomiting. 20 tablet 5  . topiramate (TOPAMAX) 25 MG tablet Take 1 tablet at bedtime for one week, then take 2 tablets at bedtime. 60 tablet 0  . ibuprofen (ADVIL,MOTRIN) 600 MG tablet Take 1 tablet (600 mg total) by mouth every 6 (six) hours as needed. (Patient not taking: Reported on 11/27/2019) 30 tablet 0   No facility-administered medications prior to visit.    Review of Systems  Constitutional: Negative for chills, fever, malaise/fatigue and weight loss.  HENT: Negative for congestion.   Eyes: Negative for blurred vision.  Respiratory: Negative for cough, hemoptysis, sputum production, shortness of breath and wheezing.   Cardiovascular: Negative for chest pain, palpitations, orthopnea, claudication, leg swelling and PND.   Gastrointestinal: Positive for diarrhea (2-3 episodes per week) and nausea. Negative for blood in stool, heartburn and vomiting.  Genitourinary: Negative.   Musculoskeletal: Negative.   Neurological: Positive for dizziness, loss of consciousness, weakness and headaches. Negative for seizures.  Endo/Heme/Allergies: Does not bruise/bleed easily.  Psychiatric/Behavioral: Negative.    Objective:   Vitals:   12/05/19 1134  BP: 102/70  Pulse: 80  Temp: (!) 97.2 F (36.2 C)  TempSrc: Temporal  SpO2: 98%  Weight: 222 lb 9.6 oz (101 kg)  Height: 5\' 7"  (1.702 m)     Physical Exam Constitutional:      Appearance: Normal appearance. He is obese. He is not ill-appearing.  HENT:  Head: Normocephalic and atraumatic.     Nose: Nose normal.     Mouth/Throat:     Mouth: Mucous membranes are moist.     Pharynx: Oropharynx is clear.  Eyes:     General: No scleral icterus.    Conjunctiva/sclera: Conjunctivae normal.     Pupils: Pupils are equal, round, and reactive to light.  Cardiovascular:     Rate and Rhythm: Normal rate and regular rhythm.     Pulses: Normal pulses.     Heart sounds: Normal heart sounds. No murmur heard.   Pulmonary:     Effort: Pulmonary effort is normal.     Breath sounds: Normal breath sounds. No wheezing or rales.  Abdominal:     General: Bowel sounds are normal.     Palpations: Abdomen is soft.  Musculoskeletal:     Cervical back: Neck supple.     Right lower leg: No edema.     Left lower leg: No edema.  Lymphadenopathy:     Cervical: No cervical adenopathy.  Skin:    General: Skin is warm and dry.  Neurological:     General: No focal deficit present.     Mental Status: He is alert and oriented to person, place, and time. Mental status is at baseline.  Psychiatric:        Mood and Affect: Mood normal.        Behavior: Behavior normal.        Thought Content: Thought content normal.        Judgment: Judgment normal.     CBC No results found  for: WBC, RBC, HGB, HCT, PLT, MCV, MCH, MCHC, RDW, LYMPHSABS, MONOABS, EOSABS, BASOSABS   Chest imaging: No imaging on file  PFT: No PFT on file  Labs: No Labs on file  Echo: 09/20/19 1. Left ventricular ejection fraction, by estimation, is 60 to 65%. The  left ventricle has normal function. The left ventricle has no regional  wall motion abnormalities. There is mild concentric left ventricular  hypertrophy. Left ventricular diastolic  parameters were normal.  2. Right ventricular systolic function is normal. The right ventricular  size is normal. There is normal pulmonary artery systolic pressure.  3. The mitral valve is normal in structure. No evidence of mitral valve  regurgitation. No evidence of mitral stenosis.  4. The aortic valve is tricuspid. Aortic valve regurgitation is not  visualized. No aortic stenosis is present.  5. The inferior vena cava is normal in size with greater than 50%  respiratory variability, suggesting right atrial pressure of 3 mmHg.  6. Agitated saline contrast bubble study was negative, with no evidence  of any interatrial shunt.  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   He appears to be having possible hyperadrenergic spells as he is having episodes of headache, dizziness, and diaphoresis in which there is associated chest tightness and dyspnea. There does appear to be a component of paroxysmal high blood pressures from self reported measurements at home during these episodes. He does not appear to have onset of wheezing, cough or chest tightness that led to these episodes, so I am less concerned for asthma or reactive airways disease.   He reported having labs drawn at his primary care's office for concern  of catecholamine secreting tumors. I do not currently have these records for review but we will request these for further review prior to checking 24 hour urine metanephrines and catecholamines.   He does report snoring and not feeling rested after sleep, so we will send him for home sleep study to evaluate for obstructive sleep apnea which could be leading to these underlying hypertensive episodes. He will also be referred to sleep medicine for component of insomnia as well.   He is to follow up in 3 months  Melody Comas, MD [facility] Office: [phone]        Current Outpatient Medications:  .  albuterol (VENTOLIN HFA) 108 (90 Base) MCG/ACT inhaler, Inhale into the lungs., Disp: , Rfl:  .  celecoxib (CELEBREX) 200 MG capsule, Take 200 mg by mouth as needed., Disp: , Rfl:  .  fexofenadine (ALLEGRA) 180 MG tablet, Take 180 mg by mouth daily., Disp: , Rfl:  .  ondansetron (ZOFRAN ODT) 4 MG disintegrating tablet, Take 1 tablet (4 mg total) by mouth every 8 (eight) hours as needed for nausea or vomiting., Disp: 20 tablet, Rfl: 5 .  topiramate (TOPAMAX) 25 MG tablet, Take 1 tablet at bedtime for one week, then take 2 tablets at bedtime., Disp: 60 tablet, Rfl: 0 .  ibuprofen (ADVIL,MOTRIN) 600 MG tablet, Take 1 tablet (600 mg total) by mouth every 6 (six) hours as needed. (Patient not taking: Reported on 11/27/2019), Disp: 30 tablet, Rfl: 0

## 2019-12-06 ENCOUNTER — Ambulatory Visit: Payer: BLUE CROSS/BLUE SHIELD | Admitting: Cardiology

## 2019-12-14 DIAGNOSIS — R011 Cardiac murmur, unspecified: Secondary | ICD-10-CM | POA: Insufficient documentation

## 2019-12-14 DIAGNOSIS — M419 Scoliosis, unspecified: Secondary | ICD-10-CM | POA: Insufficient documentation

## 2019-12-14 DIAGNOSIS — S62339A Displaced fracture of neck of unspecified metacarpal bone, initial encounter for closed fracture: Secondary | ICD-10-CM | POA: Insufficient documentation

## 2019-12-16 ENCOUNTER — Ambulatory Visit
Admission: RE | Admit: 2019-12-16 | Discharge: 2019-12-16 | Disposition: A | Discharge status: Home / Self Care | Payer: BLUE CROSS/BLUE SHIELD | Source: Ambulatory Visit | Attending: Neurology | Admitting: Neurology

## 2019-12-16 DIAGNOSIS — G450 Vertebro-basilar artery syndrome: Secondary | ICD-10-CM

## 2019-12-16 DIAGNOSIS — G43109 Migraine with aura, not intractable, without status migrainosus: Secondary | ICD-10-CM

## 2019-12-16 IMAGING — MR MR MRA NECK WO/W CM
1 of 2 series · 23 of 48 positions shown · IV contrast (multihance)
Comparison: Previous CT from [DATE].

CLINICAL DATA: Initial evaluation for vertebrobasilar
insufficiency. History of headaches and dizziness since [DATE].
Syncope.

EXAM:
MRI HEAD WITHOUT CONTRAST
MRA HEAD WITHOUT CONTRAST
MRA NECK WITHOUT AND WITH CONTRAST
TECHNIQUE: Multiplanar, multiecho pulse sequences of the brain and surrounding
structures were obtained without intravenous contrast. Angiographic
images of the Circle of Willis were obtained using MRA technique
without intravenous contrast. Angiographic images of the neck were
obtained using MRA technique without and with intravenous contrast.
Carotid stenosis measurements (when applicable) are obtained
utilizing NASCET criteria, using the distal internal carotid
diameter as the denominator.
CONTRAST:  20mL MULTIHANCE GADOBENATE DIMEGLUMINE 529 MG/ML IV SOLN

[Series 12: [id number]_sub · coronal · 0.8mm · 0.85mm/px · 23 of 92 slices shown]
[im 1/92]
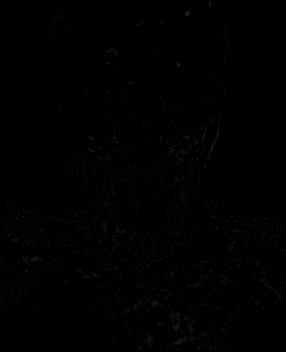
[im 5/92]
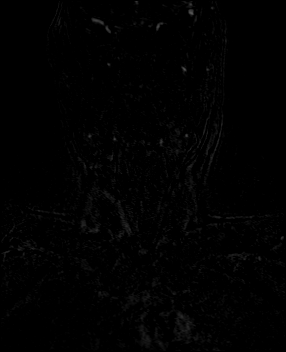
[im 9/92]
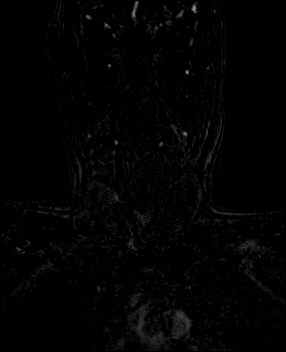
[im 13/92]
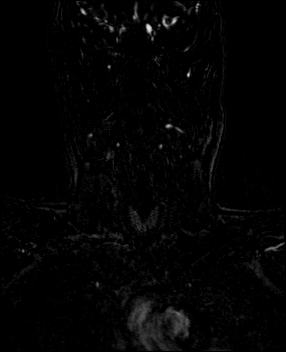
[im 17/92]
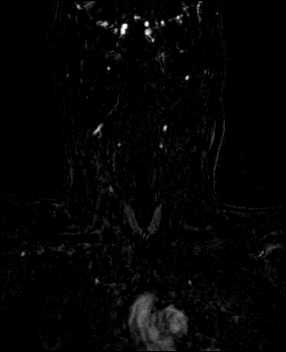
[im 21/92]
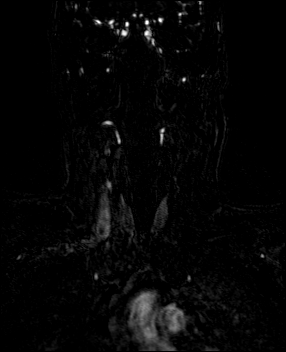
[im 25/92]
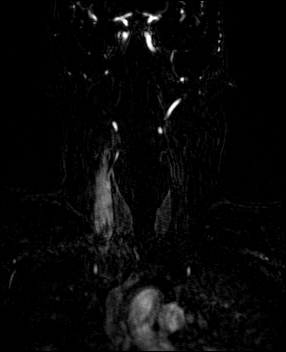
[im 29/92]
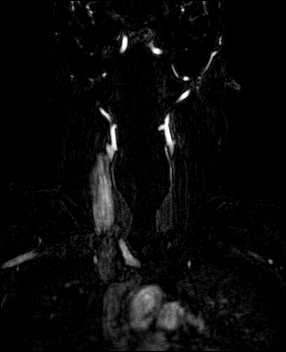
[im 34/92]
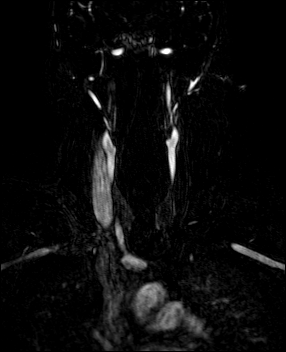
[im 38/92]
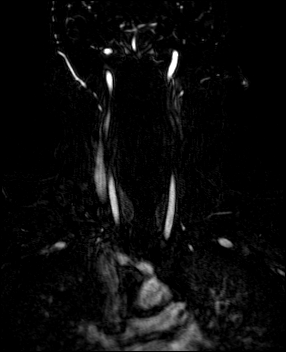
[im 42/92]
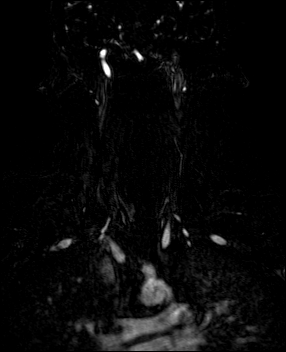
[im 46/92]
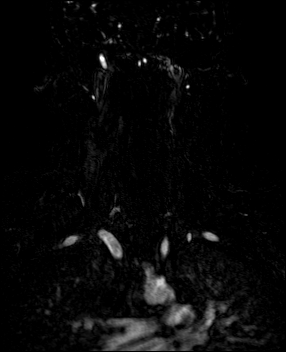
[im 50/92]
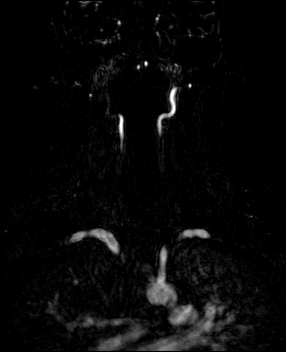
[im 54/92]
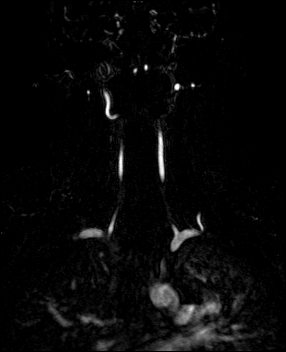
[im 58/92]
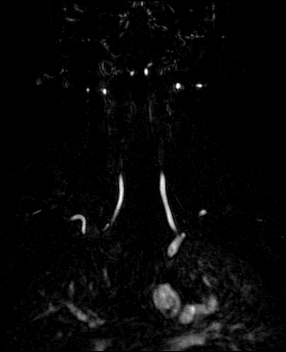
[im 63/92]
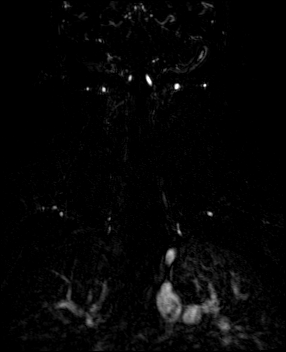
[im 67/92]
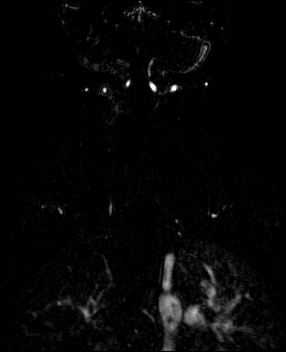
[im 71/92]
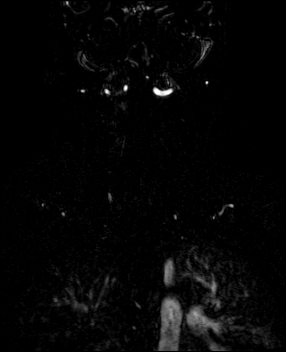
[im 75/92]
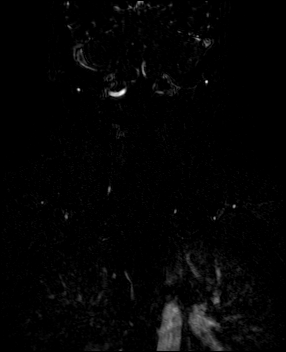
[im 79/92]
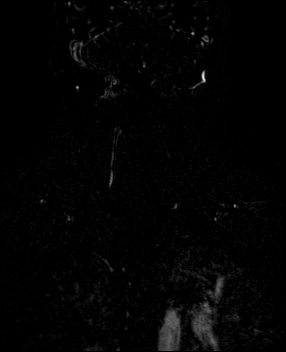
[im 83/92]
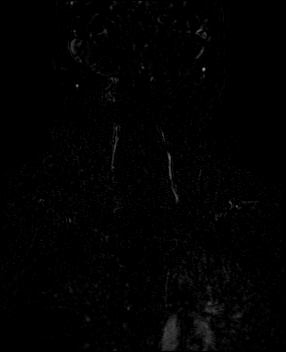
[im 87/92]
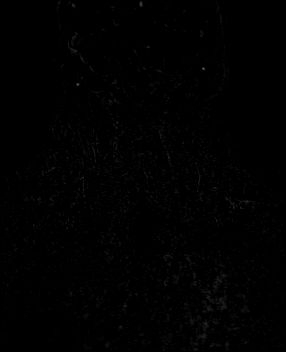
[im 92/92]
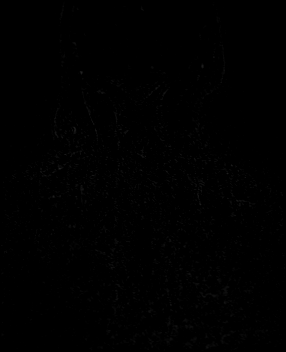

[23 of 48 positions shown; findings below may reference images not displayed]

FINDINGS: MRI HEAD FINDINGS

Brain: Cerebral volume within normal limits for patient age. Single
subcentimeter focus of FLAIR hyperintensity noted within the
subcortical white matter of the anterior right frontal lobe (series
17, image 17), nonspecific, but of doubtful significance in
isolation. No other focal parenchymal signal abnormality.

No abnormal foci of restricted diffusion to suggest acute or
subacute ischemia. Gray-white matter differentiation well
maintained. No encephalomalacia to suggest chronic infarction. No
foci of susceptibility artifact to suggest acute or chronic
intracranial hemorrhage.

No mass lesion, midline shift or mass effect. No hydrocephalus. No
extra-axial fluid collection. Major dural sinuses are grossly
patent.

Pituitary gland and suprasellar region are normal. Midline
structures intact and normal.

Vascular: Major intracranial vascular flow voids well maintained and
normal in appearance.

Skull and upper cervical spine: Craniocervical junction normal.
Visualized upper cervical spine within normal limits. Bone marrow
signal intensity normal. No scalp soft tissue abnormality.

Sinuses/Orbits: Globes and orbital soft tissues within normal
limits.

Paranasal sinuses are clear. No mastoid effusion. Inner ear
structures normal.

Other: None.

MRA HEAD FINDINGS

ANTERIOR CIRCULATION:

Distal cervical segments of the internal carotid arteries are widely
patent with symmetric antegrade flow. Petrous, cavernous, and
supraclinoid ICAs widely patent without stenosis or other
abnormality. Origin of the ophthalmic arteries patent bilaterally.
ICA termini well perfused. A1 segments patent bilaterally. Normal
anterior communicating artery complex. Anterior cerebral arteries
widely patent their distal aspects without stenosis. No M1 stenosis
or occlusion. Normal MCA bifurcations. Distal MCA branches well
perfused and symmetric.

POSTERIOR CIRCULATION:

Vertebral arteries widely patent to the vertebrobasilar junction
without stenosis. Left vertebral artery slightly dominant. Both
PICAs patent. Basilar widely patent and well perfused to its distal
aspect without stenosis. Dominant right AICA. Superior cerebellar
arteries patent bilaterally. Both PCAs primarily supplied via the
basilar well perfused to their distal aspects. Small bilateral
posterior communicating arteries noted.

No intracranial aneurysm or other vascular abnormality.

MRA NECK FINDINGS

AORTIC ARCH: Visualized aortic arch of normal caliber with normal 3
vessel morphology. No hemodynamically significant stenosis seen
about the origin of the great vessels.

RIGHT CAROTID SYSTEM: Right common and internal carotid arteries
widely patent without stenosis, dissection or occlusion. No
significant atheromatous irregularity or narrowing about the right
carotid bifurcation.

LEFT CAROTID SYSTEM: Left common and internal carotid arteries
widely patent stenosis dissection or. No significant atheromatous
narrowing or irregularity about the carotid bifurcation.

VERTEBRAL ARTERIES: Both vertebral arteries arise from the
subclavian arteries. No proximal subclavian artery stenosis. Left
vertebral artery slightly dominant. Both vertebral arteries widely
patent within the neck without stenosis, dissection or occlusion.
IMPRESSION: 1. Normal brain MRI for age. No findings to explain patient's
symptoms identified.
2. Normal intracranial MRA.
3. Normal MRA of the neck. No evidence for vertebrobasilar
insufficiency.

## 2019-12-16 IMAGING — MR MR MRA HEAD W/O CM
1 series · 9 of 48 positions shown · IV contrast (multihance)
Comparison: Previous CT from [DATE].

CLINICAL DATA: Initial evaluation for vertebrobasilar
insufficiency. History of headaches and dizziness since [DATE].
Syncope.

EXAM:
MRI HEAD WITHOUT CONTRAST
MRA HEAD WITHOUT CONTRAST
MRA NECK WITHOUT AND WITH CONTRAST
TECHNIQUE: Multiplanar, multiecho pulse sequences of the brain and surrounding
structures were obtained without intravenous contrast. Angiographic
images of the Circle of Willis were obtained using MRA technique
without intravenous contrast. Angiographic images of the neck were
obtained using MRA technique without and with intravenous contrast.
Carotid stenosis measurements (when applicable) are obtained
utilizing NASCET criteria, using the distal internal carotid
diameter as the denominator.
CONTRAST:  20mL MULTIHANCE GADOBENATE DIMEGLUMINE 529 MG/ML IV SOLN

[Series 4: tof_fl3d_tra_p2_multi-slab · axial · 0.6mm · 0.26mm/px · z∈[-31,+38]mm · 9 of 149 slices shown]
[im 10/149]
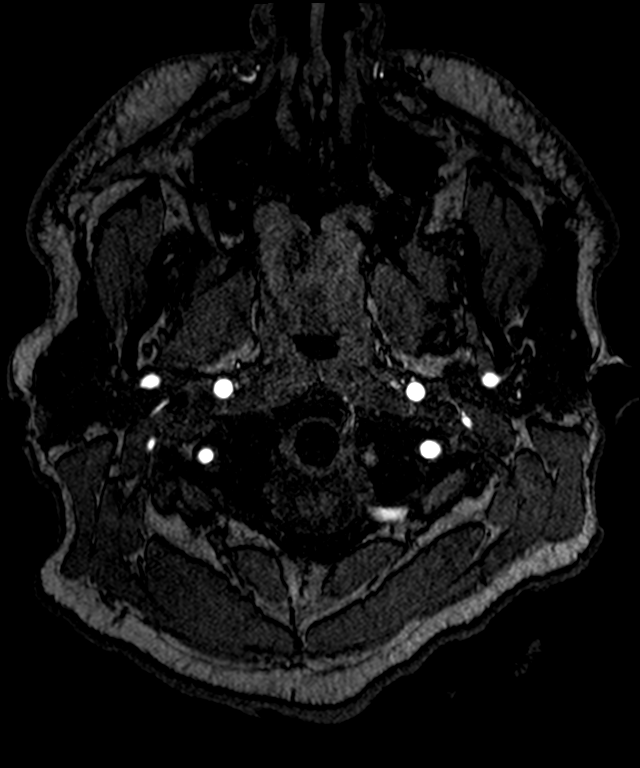
[im 26/149]
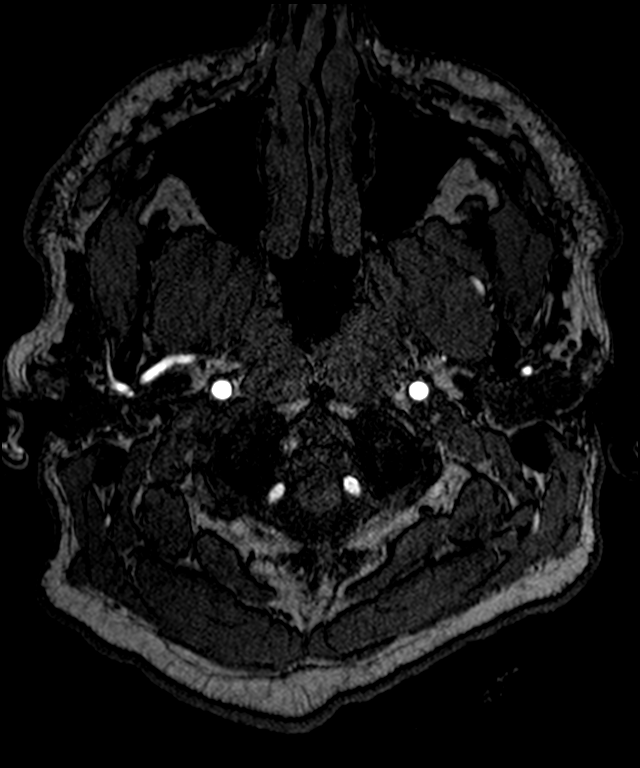
[im 29/149]
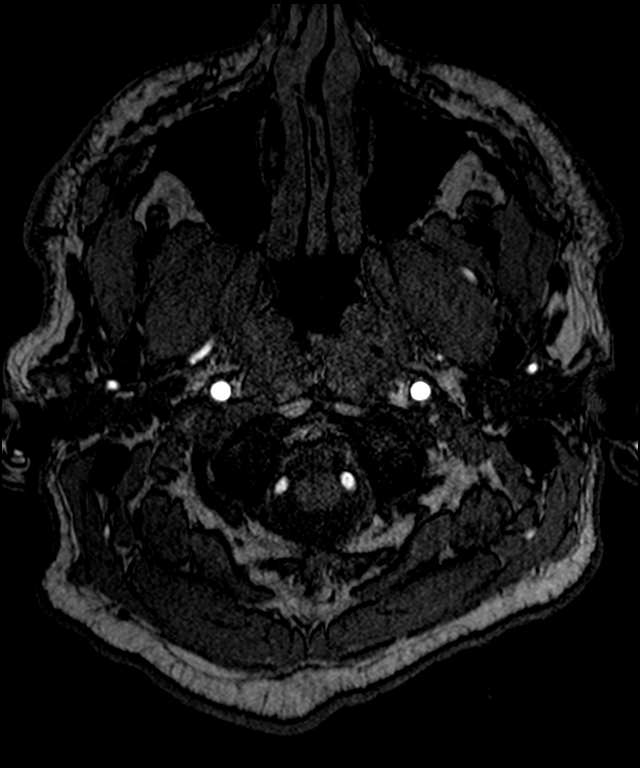
[im 48/149]
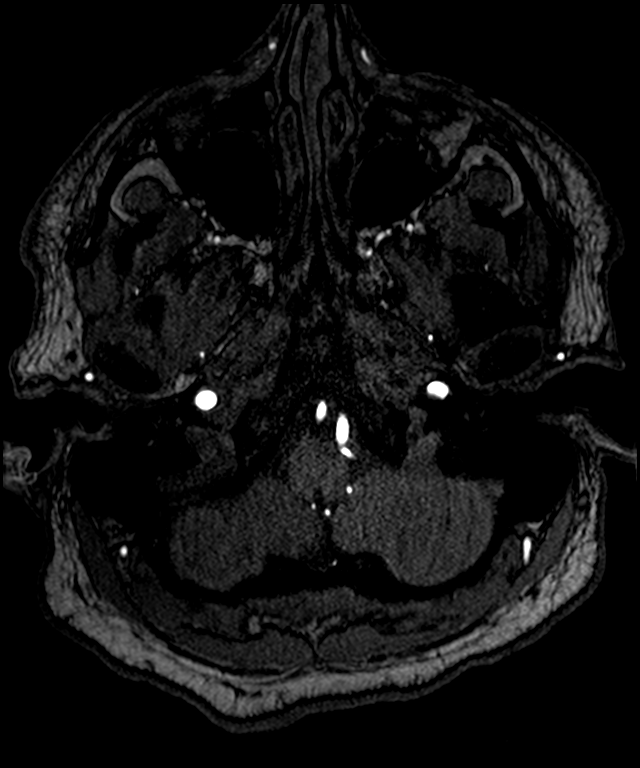
[im 67/149]
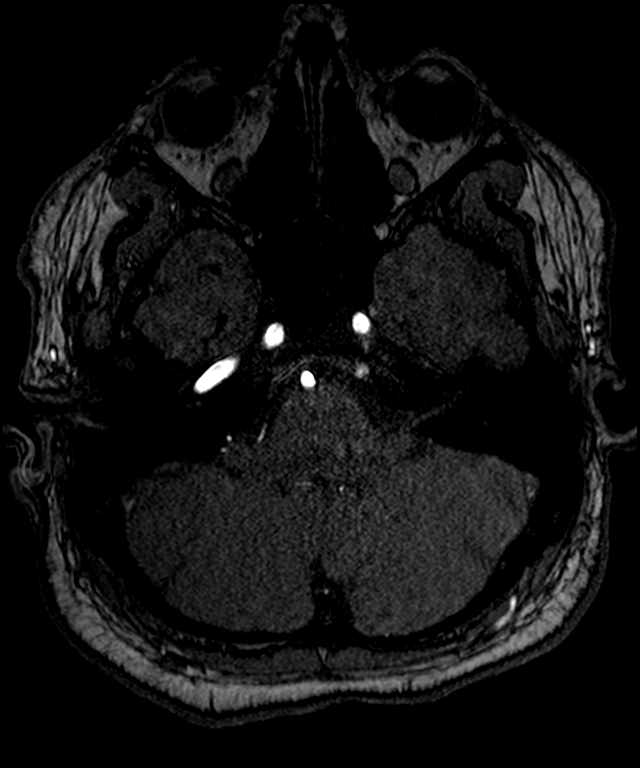
[im 76/149]
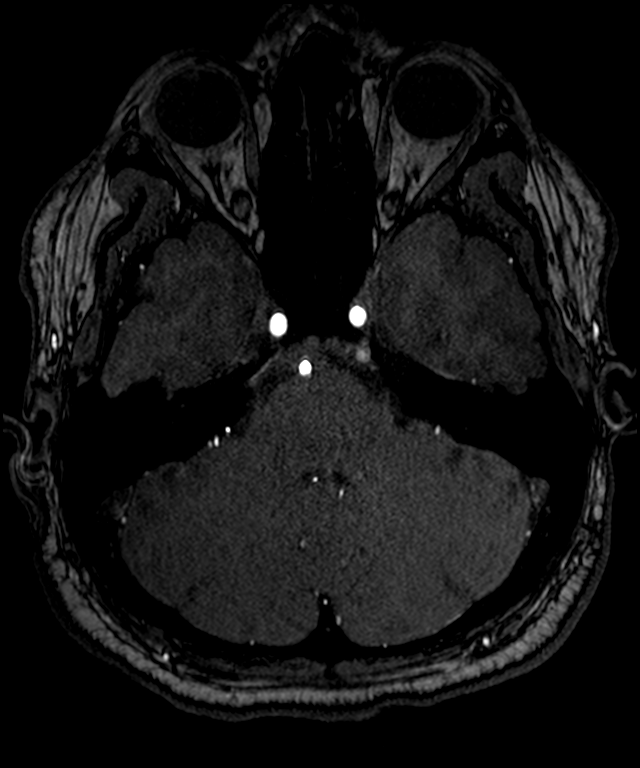
[im 86/149]
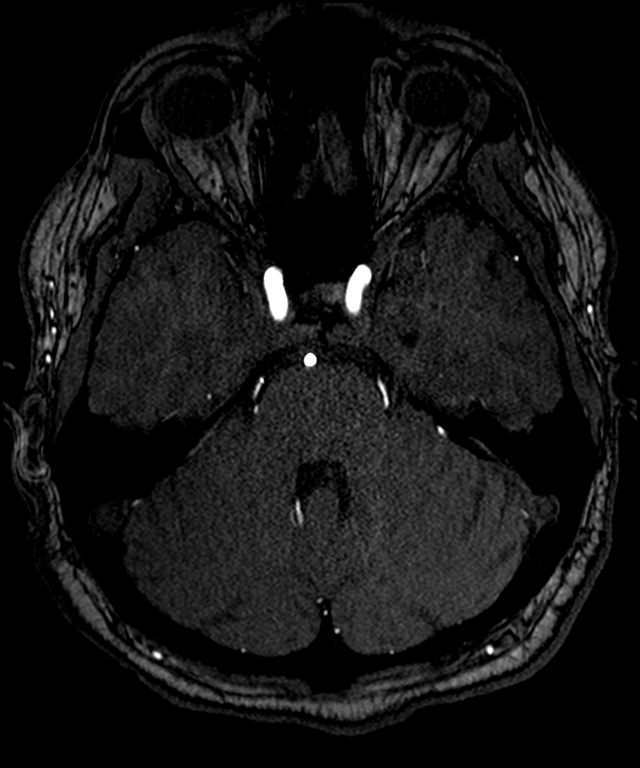
[im 104/149]
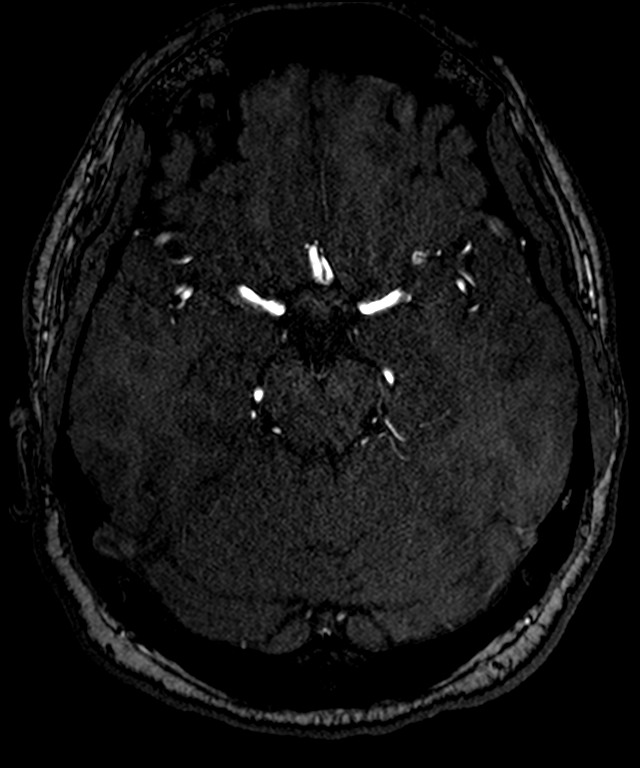
[im 126/149]
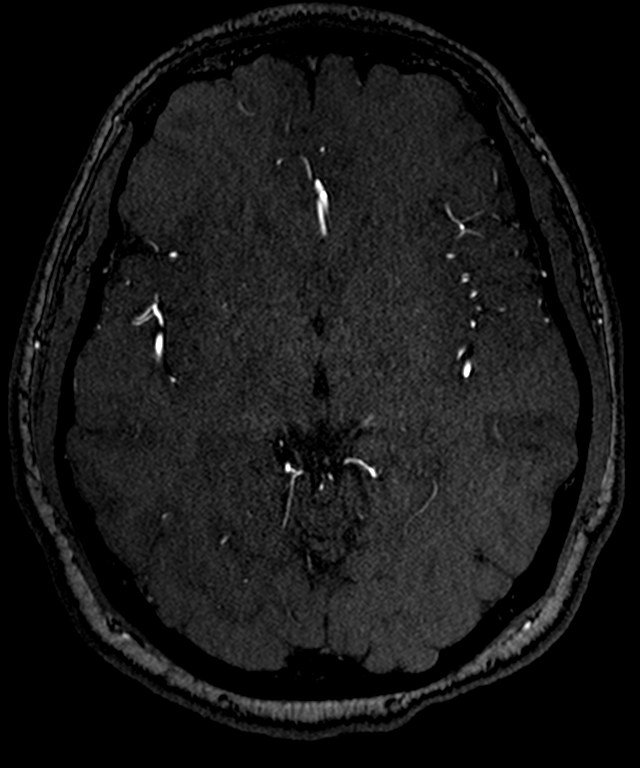

[9 of 48 positions shown; findings below may reference images not displayed]

FINDINGS: MRI HEAD FINDINGS

Brain: Cerebral volume within normal limits for patient age. Single
subcentimeter focus of FLAIR hyperintensity noted within the
subcortical white matter of the anterior right frontal lobe (series
17, image 17), nonspecific, but of doubtful significance in
isolation. No other focal parenchymal signal abnormality.

No abnormal foci of restricted diffusion to suggest acute or
subacute ischemia. Gray-white matter differentiation well
maintained. No encephalomalacia to suggest chronic infarction. No
foci of susceptibility artifact to suggest acute or chronic
intracranial hemorrhage.

No mass lesion, midline shift or mass effect. No hydrocephalus. No
extra-axial fluid collection. Major dural sinuses are grossly
patent.

Pituitary gland and suprasellar region are normal. Midline
structures intact and normal.

Vascular: Major intracranial vascular flow voids well maintained and
normal in appearance.

Skull and upper cervical spine: Craniocervical junction normal.
Visualized upper cervical spine within normal limits. Bone marrow
signal intensity normal. No scalp soft tissue abnormality.

Sinuses/Orbits: Globes and orbital soft tissues within normal
limits.

Paranasal sinuses are clear. No mastoid effusion. Inner ear
structures normal.

Other: None.

MRA HEAD FINDINGS

ANTERIOR CIRCULATION:

Distal cervical segments of the internal carotid arteries are widely
patent with symmetric antegrade flow. Petrous, cavernous, and
supraclinoid ICAs widely patent without stenosis or other
abnormality. Origin of the ophthalmic arteries patent bilaterally.
ICA termini well perfused. A1 segments patent bilaterally. Normal
anterior communicating artery complex. Anterior cerebral arteries
widely patent their distal aspects without stenosis. No M1 stenosis
or occlusion. Normal MCA bifurcations. Distal MCA branches well
perfused and symmetric.

POSTERIOR CIRCULATION:

Vertebral arteries widely patent to the vertebrobasilar junction
without stenosis. Left vertebral artery slightly dominant. Both
PICAs patent. Basilar widely patent and well perfused to its distal
aspect without stenosis. Dominant right AICA. Superior cerebellar
arteries patent bilaterally. Both PCAs primarily supplied via the
basilar well perfused to their distal aspects. Small bilateral
posterior communicating arteries noted.

No intracranial aneurysm or other vascular abnormality.

MRA NECK FINDINGS

AORTIC ARCH: Visualized aortic arch of normal caliber with normal 3
vessel morphology. No hemodynamically significant stenosis seen
about the origin of the great vessels.

RIGHT CAROTID SYSTEM: Right common and internal carotid arteries
widely patent without stenosis, dissection or occlusion. No
significant atheromatous irregularity or narrowing about the right
carotid bifurcation.

LEFT CAROTID SYSTEM: Left common and internal carotid arteries
widely patent stenosis dissection or. No significant atheromatous
narrowing or irregularity about the carotid bifurcation.

VERTEBRAL ARTERIES: Both vertebral arteries arise from the
subclavian arteries. No proximal subclavian artery stenosis. Left
vertebral artery slightly dominant. Both vertebral arteries widely
patent within the neck without stenosis, dissection or occlusion.
IMPRESSION: 1. Normal brain MRI for age. No findings to explain patient's
symptoms identified.
2. Normal intracranial MRA.
3. Normal MRA of the neck. No evidence for vertebrobasilar
insufficiency.

## 2019-12-16 IMAGING — MR MR HEAD W/O CM
8 series · 48 of 48 positions shown · IV contrast (multihance)
Comparison: Previous CT from [DATE].

CLINICAL DATA: Initial evaluation for vertebrobasilar
insufficiency. History of headaches and dizziness since [DATE].
Syncope.

EXAM:
MRI HEAD WITHOUT CONTRAST
MRA HEAD WITHOUT CONTRAST
MRA NECK WITHOUT AND WITH CONTRAST
TECHNIQUE: Multiplanar, multiecho pulse sequences of the brain and surrounding
structures were obtained without intravenous contrast. Angiographic
images of the Circle of Willis were obtained using MRA technique
without intravenous contrast. Angiographic images of the neck were
obtained using MRA technique without and with intravenous contrast.
Carotid stenosis measurements (when applicable) are obtained
utilizing NASCET criteria, using the distal internal carotid
diameter as the denominator.
CONTRAST:  20mL MULTIHANCE GADOBENATE DIMEGLUMINE 529 MG/ML IV SOLN

[Series 5: T1 · sagittal · 4.0mm · 0.75mm/px · 2 of 29 slices shown (1 of 2)]
[im 1/29]
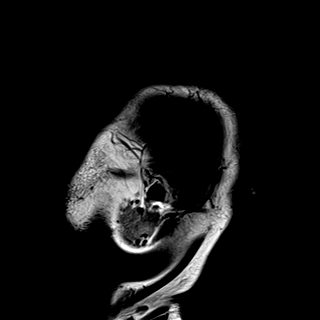
[im 29/29]
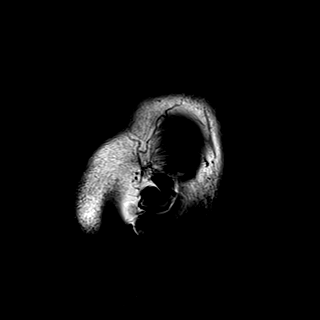

[Series 14: DWI · axial · 3.0mm · 1.44mm/px · z∈[-46,+89]mm · 9 of 84 slices shown (1 of 2)]
[im 1/84]
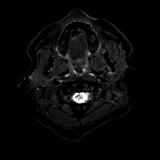
[im 11/84]
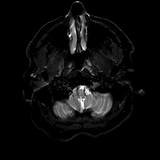
[im 21/84]
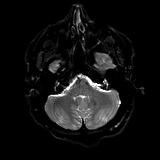
[im 32/84]
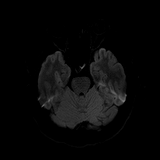
[im 42/84]
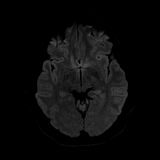
[im 52/84]
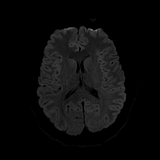
[im 63/84]
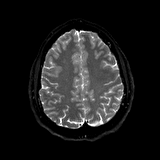
[im 73/84]
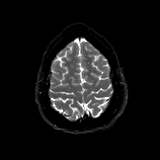
[im 84/84]
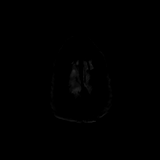

[Series 15: DWI · axial · 3.0mm · 1.44mm/px · z∈[-46,+89]mm · 4 of 42 slices shown (2 of 2)]
[im 1/42]
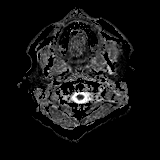
[im 14/42]
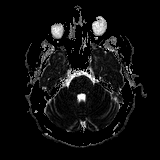
[im 28/42]
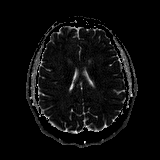
[im 42/42]
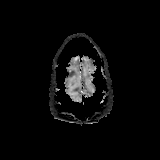

[Series 16: T2 · axial · 4.0mm · 0.36mm/px · z∈[-44,+91]mm · 3 of 27 slices shown (1 of 2)]
[im 1/27]
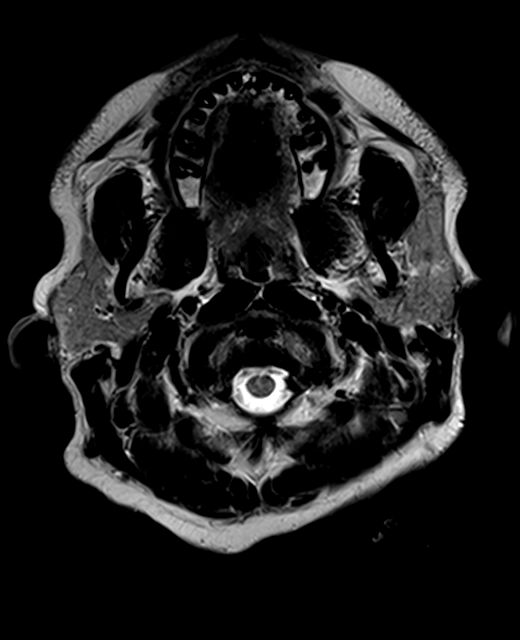
[im 14/27]
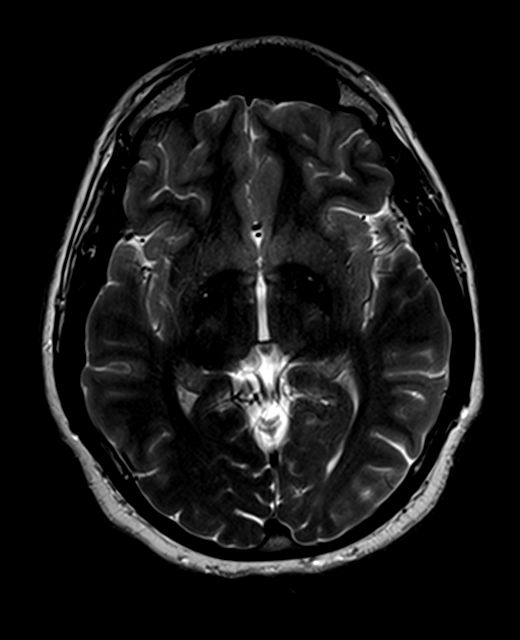
[im 27/27]
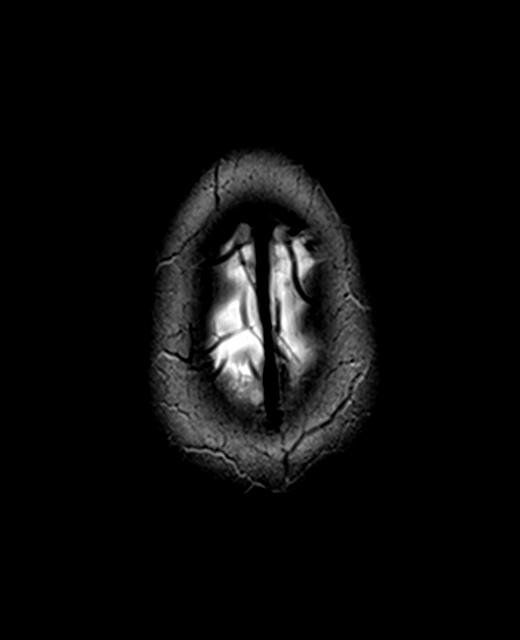

[Series 17: FLAIR · axial · 3.0mm · 0.72mm/px · z∈[-53,+97]mm · 3 of 26 slices shown]
[im 1/26]
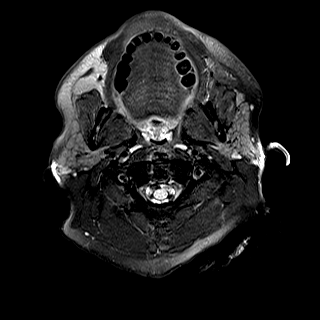
[im 13/26]
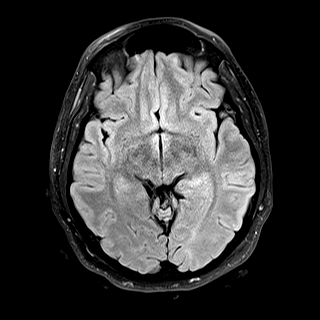
[im 26/26]
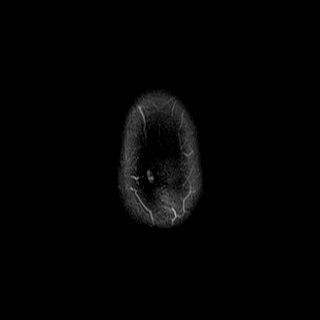

[Series 19: swi_images · axial · 2.2mm · 0.90mm/px · z∈[-46,+92]mm · 7 of 64 slices shown]
[im 1/64]
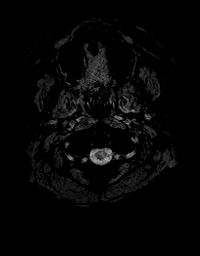
[im 11/64]
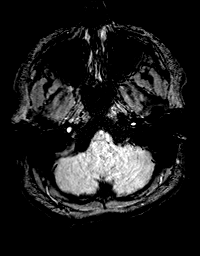
[im 22/64]
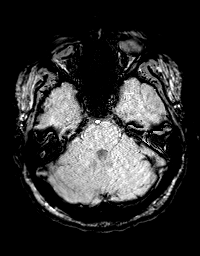
[im 32/64]
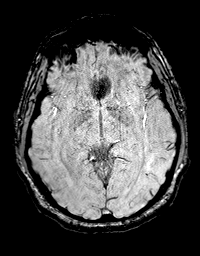
[im 43/64]
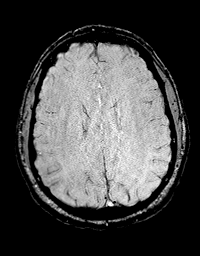
[im 53/64]
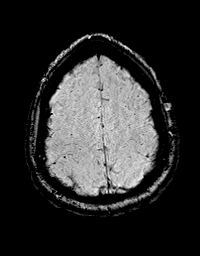
[im 64/64]
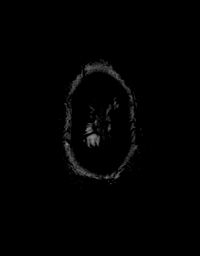

[Series 20: T1 · axial · 1.0mm · 0.94mm/px · z∈[-56,+103]mm · 17 of 160 slices shown (2 of 2)]
[im 1/160]
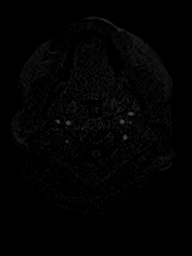
[im 10/160]
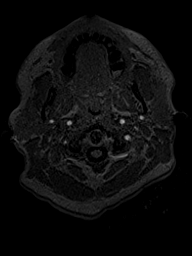
[im 20/160]
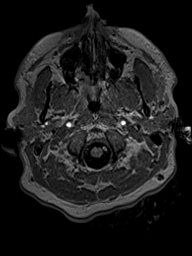
[im 30/160]
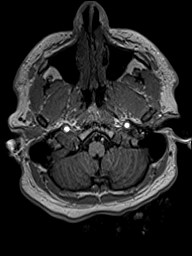
[im 40/160]
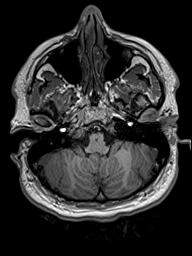
[im 50/160]
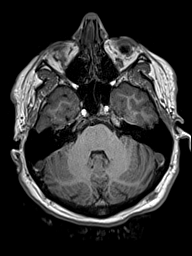
[im 60/160]
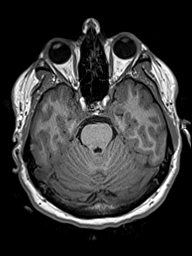
[im 70/160]
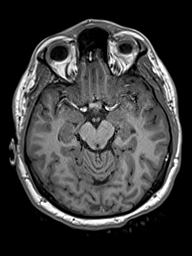
[im 80/160]
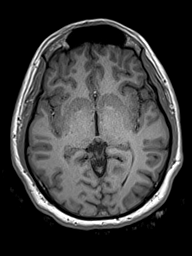
[im 90/160]
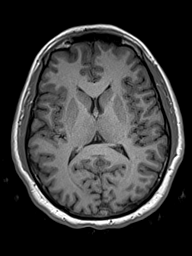
[im 100/160]
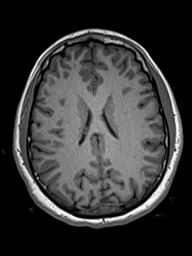
[im 110/160]
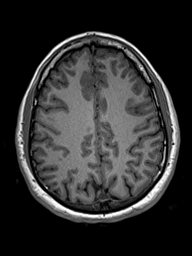
[im 120/160]
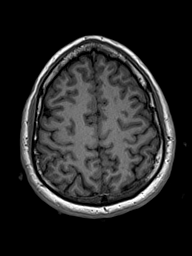
[im 130/160]
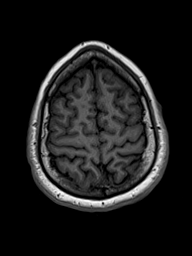
[im 140/160]
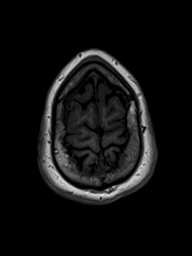
[im 150/160]
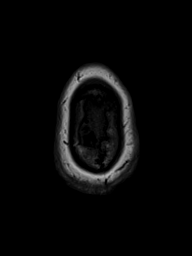
[im 160/160]
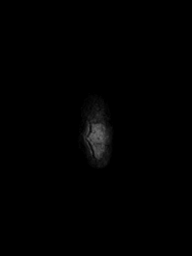

[Series 21: T2 · coronal · 4.5mm · 0.36mm/px · 3 of 26 slices shown (2 of 2)]
[im 1/26]
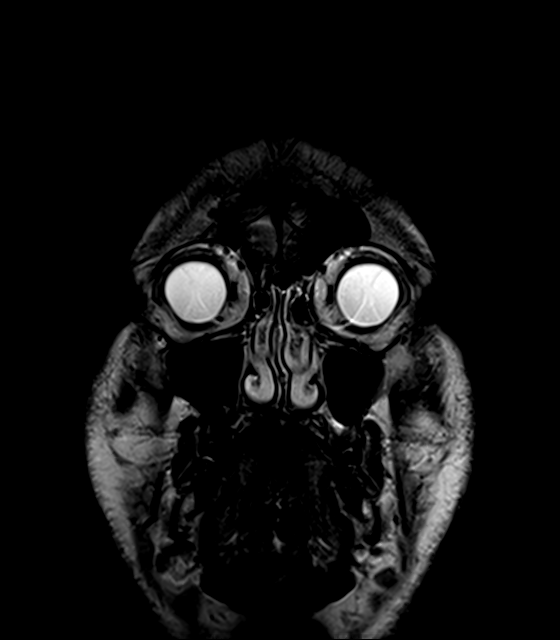
[im 13/26]
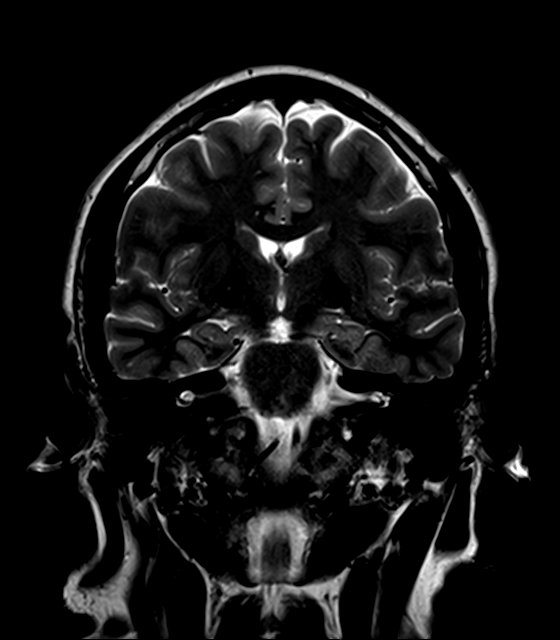
[im 26/26]
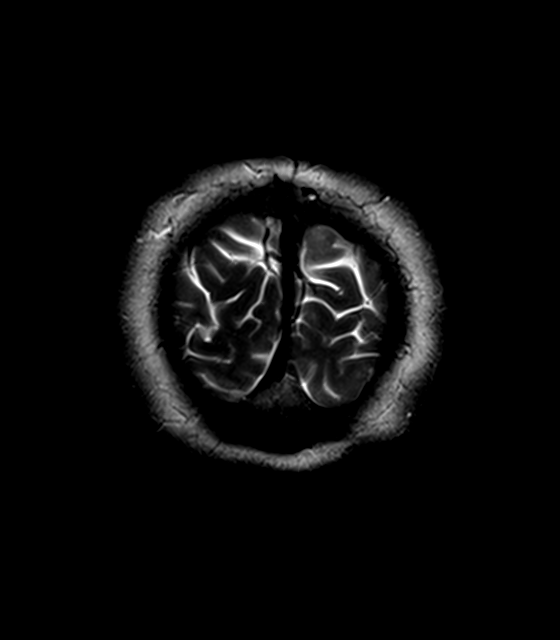

[48 of 48 positions shown; findings below may reference images not displayed]

FINDINGS: MRI HEAD FINDINGS

Brain: Cerebral volume within normal limits for patient age. Single
subcentimeter focus of FLAIR hyperintensity noted within the
subcortical white matter of the anterior right frontal lobe (series
17, image 17), nonspecific, but of doubtful significance in
isolation. No other focal parenchymal signal abnormality.

No abnormal foci of restricted diffusion to suggest acute or
subacute ischemia. Gray-white matter differentiation well
maintained. No encephalomalacia to suggest chronic infarction. No
foci of susceptibility artifact to suggest acute or chronic
intracranial hemorrhage.

No mass lesion, midline shift or mass effect. No hydrocephalus. No
extra-axial fluid collection. Major dural sinuses are grossly
patent.

Pituitary gland and suprasellar region are normal. Midline
structures intact and normal.

Vascular: Major intracranial vascular flow voids well maintained and
normal in appearance.

Skull and upper cervical spine: Craniocervical junction normal.
Visualized upper cervical spine within normal limits. Bone marrow
signal intensity normal. No scalp soft tissue abnormality.

Sinuses/Orbits: Globes and orbital soft tissues within normal
limits.

Paranasal sinuses are clear. No mastoid effusion. Inner ear
structures normal.

Other: None.

MRA HEAD FINDINGS

ANTERIOR CIRCULATION:

Distal cervical segments of the internal carotid arteries are widely
patent with symmetric antegrade flow. Petrous, cavernous, and
supraclinoid ICAs widely patent without stenosis or other
abnormality. Origin of the ophthalmic arteries patent bilaterally.
ICA termini well perfused. A1 segments patent bilaterally. Normal
anterior communicating artery complex. Anterior cerebral arteries
widely patent their distal aspects without stenosis. No M1 stenosis
or occlusion. Normal MCA bifurcations. Distal MCA branches well
perfused and symmetric.

POSTERIOR CIRCULATION:

Vertebral arteries widely patent to the vertebrobasilar junction
without stenosis. Left vertebral artery slightly dominant. Both
PICAs patent. Basilar widely patent and well perfused to its distal
aspect without stenosis. Dominant right AICA. Superior cerebellar
arteries patent bilaterally. Both PCAs primarily supplied via the
basilar well perfused to their distal aspects. Small bilateral
posterior communicating arteries noted.

No intracranial aneurysm or other vascular abnormality.

MRA NECK FINDINGS

AORTIC ARCH: Visualized aortic arch of normal caliber with normal 3
vessel morphology. No hemodynamically significant stenosis seen
about the origin of the great vessels.

RIGHT CAROTID SYSTEM: Right common and internal carotid arteries
widely patent without stenosis, dissection or occlusion. No
significant atheromatous irregularity or narrowing about the right
carotid bifurcation.

LEFT CAROTID SYSTEM: Left common and internal carotid arteries
widely patent stenosis dissection or. No significant atheromatous
narrowing or irregularity about the carotid bifurcation.

VERTEBRAL ARTERIES: Both vertebral arteries arise from the
subclavian arteries. No proximal subclavian artery stenosis. Left
vertebral artery slightly dominant. Both vertebral arteries widely
patent within the neck without stenosis, dissection or occlusion.
IMPRESSION: 1. Normal brain MRI for age. No findings to explain patient's
symptoms identified.
2. Normal intracranial MRA.
3. Normal MRA of the neck. No evidence for vertebrobasilar
insufficiency.

## 2019-12-16 MED ORDER — GADOBENATE DIMEGLUMINE 529 MG/ML IV SOLN
20.0000 mL | Freq: Once | INTRAVENOUS | Status: AC | PRN
  Administered 2019-12-16: 20 mL via INTRAVENOUS

## 2019-12-19 ENCOUNTER — Ambulatory Visit: Payer: BLUE CROSS/BLUE SHIELD | Admitting: Cardiology

## 2020-01-17 ENCOUNTER — Encounter: Payer: Self-pay | Admitting: Pulmonary Disease

## 2020-02-25 ENCOUNTER — Ambulatory Visit: Payer: BLUE CROSS/BLUE SHIELD | Admitting: Cardiology

## 2020-03-25 ENCOUNTER — Other Ambulatory Visit: Payer: Self-pay | Admitting: Neurology

## 2020-03-25 ENCOUNTER — Telehealth: Payer: Self-pay | Admitting: Neurology

## 2020-03-25 MED ORDER — TOPIRAMATE 50 MG PO TABS: ORAL_TABLET | ORAL | 0 refills | Status: DC

## 2020-03-25 MED ORDER — TOPIRAMATE 50 MG PO TABS: 50.0000 mg | ORAL_TABLET | Freq: Every day | ORAL | 5 refills | Status: DC

## 2020-03-25 MED ORDER — NURTEC 75 MG PO TBDP: 75.0000 mg | ORAL_TABLET | Freq: Every day | ORAL | 5 refills | Status: DC | PRN

## 2020-03-25 NOTE — Telephone Encounter (Signed)
chelsea can you start a PA on Nurtec please. Not sure if it is needed pt due for f/u in March.   Dr.jaffe we will check the status of the coverage. Will you send a script for the Nurtec.

## 2020-03-25 NOTE — Telephone Encounter (Signed)
Prescription for topiramate 50mg  at bedtime and Nurtec sent to [facility]

## 2020-03-25 NOTE — Telephone Encounter (Signed)
Patient called in and wanted to see about getting a prescription refill for his topiramate. He also liked the Nurtec ODT, but is not sure if it is covered by insurance. He would like prescription of it if insurance will cover.

## 2020-03-26 NOTE — Telephone Encounter (Signed)
PA for the Nurtec has been submitted. Currently awaiting determination. Thanks!

## 2020-04-04 ENCOUNTER — Other Ambulatory Visit: Payer: Self-pay

## 2020-04-04 ENCOUNTER — Encounter: Payer: Self-pay | Admitting: Pulmonary Disease

## 2020-04-04 ENCOUNTER — Ambulatory Visit (INDEPENDENT_AMBULATORY_CARE_PROVIDER_SITE_OTHER): Payer: Managed Care, Other (non HMO) | Admitting: Pulmonary Disease

## 2020-04-04 VITALS — BP 130/90 | HR 80 | Temp 97.4°F | Ht 67.0 in | Wt 226.4 lb

## 2020-04-04 DIAGNOSIS — G4733 Obstructive sleep apnea (adult) (pediatric): Secondary | ICD-10-CM

## 2020-04-04 NOTE — Patient Instructions (Addendum)
Continue to follow up with Dr. Blenda Nicely for the sleep apnea and other breathing issues  Talk with you neurologist about the numbness in your left arm and leg.  Talk with your primary care for a referral to dermatology or general surgery for the cyst on the back of your neck.

## 2020-04-04 NOTE — Progress Notes (Signed)
Synopsis: Referred by Thomasene Ripple, DO for shortness of breath  Subjective:   PATIENT ID: Hale Drone GENDER: male DOB: [birth year], MRN: [id number]   HPI  Chief Complaint  Patient presents with  . Follow-up    Patient did not have HST done. Is supposed to get set up auto cpap with next Wednesday with Dr. Aldona Lento. Patient feels like his breathing is the same since last visit.    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   Since last visit he reports he has been seen by Dr. Blenda Nicely at [facility] and had a sleep study performed there and is waiting to be setup with a CPAP machine.   He continues to have episodes of dyspnea and headaches as described at the previous visit.   OV 12/05/19: He reports since April 2021 he has been having episodes of headache, dizziness, chest tightness, dyspnea and altered mentation with two reported episodes where he lost consciousness. He reports more recently that he has been checking his blood pressure during the onset of these symptoms which has been elevated above sBP 160 up to the low 200s. He reports his blood sugar is normal during these episodes. He also reports that he has facial flushing reported by his mother. He also feels diaphoretic. Prior to the onset of these episodes he does not have wheezing, cough, dyspnea or chest tightness. He has an albuterol inhaler which he uses sparingly.   As a child, he was on maintenance inhalers with as needed albuterol inhalers and nebulizer treatments. He has been weaned off inhaler therapy for many years now and he reports that the shortness of breath and chest tightness are different from previous asthma issues in childhood.   He reports he had some lab work done at the [facility] clinic in [place] as they mentioned they were concerned about his adrenal glands with the blood pressure  issues.   He was evaluated by cardiology in July.   Echocardiogram in August was normal with EF 60-65% with negative bubble study.  Cardiac event monitor showed sinus to sinus tachycardia with events as well as some asymptomatic junctional rhythm with some pauses up to 3.6 seconds but no significant arrhythmia to correlate with is events.  Cardiac etiology was ruled out.   He is being evaluated by Neurology and has been started on Topiramate for the headaches and concern for possible migraines. He is schedule for head/neck MRI/MRA later this month.  He does report snoring at night. He reports trouble falling asleep and staying asleep. When he wakes in the morning, he feels delirious which can take some of the morning to clear. His mother and grandmother have sleep apnea. He does not smoke cigarettes. He reports smoking marijuana prior to April 2021 but he has not smoked since the onset of these spells.   Past Medical History:  Diagnosis Date  . Boxer's fracture   . 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 Heart murmur   . Mixed hyperlipidemia 08/30/2019  . Obesity (BMI 30-39.9) 08/30/2019  . Palpitations 08/30/2019  . Scoliosis   . Shortness of breath 08/30/2019  . Syncope and collapse 08/30/2019     Family History  Problem Relation Age of Onset  . Heart disease Mother   . Hypertension Mother   . Heart disease Maternal Grandmother      Social History   Socioeconomic History  .  Marital status: Single    Spouse name: Not on file  . Number of children: Not on file  . Years of education: Not on file  . Highest education level: Not on file  Occupational History  . Not on file  Tobacco Use  . Smoking status: Never Smoker  . Smokeless tobacco: Never Used  Vaping Use  . Vaping Use: Never used  Substance and Sexual Activity  . Alcohol use: Yes    Comment: Once or twice a month   . Drug use: Yes    Types: Marijuana    Comment: Last used: 2 weeks ago   . Sexual activity: Not on file  Other  Topics Concern  . Not on file  Social History Narrative   Right handed   Social Determinants of Health   Financial Resource Strain: Not on file  Food Insecurity: Not on file  Transportation Needs: Not on file  Physical Activity: Not on file  Stress: Not on file  Social Connections: Not on file  Intimate Partner Violence: Not on file     Allergies  Allergen Reactions  . Flonase [Fluticasone Propionate] Other (See Comments)    Caused nose bleeds     Outpatient Medications Prior to Visit  Medication Sig Dispense Refill  . albuterol (VENTOLIN HFA) 108 (90 Base) MCG/ACT inhaler Inhale into the lungs.    . fexofenadine (ALLEGRA) 180 MG tablet Take 180 mg by mouth daily.    . ondansetron (ZOFRAN ODT) 4 MG disintegrating tablet Take 1 tablet (4 mg total) by mouth every 8 (eight) hours as needed for nausea or vomiting. 20 tablet 5  . Rimegepant Sulfate (NURTEC) 75 MG TBDP Take 75 mg by mouth daily as needed. 16 tablet 5  . topiramate (TOPAMAX) 50 MG tablet Take 1 tablet (50 mg total) by mouth at bedtime. 30 tablet 5  . celecoxib (CELEBREX) 200 MG capsule Take 200 mg by mouth as needed.    Marland Kitchen ibuprofen (ADVIL,MOTRIN) 600 MG tablet Take 1 tablet (600 mg total) by mouth every 6 (six) hours as needed. 30 tablet 0   No facility-administered medications prior to visit.    Review of Systems  Constitutional: Negative for chills, fever, malaise/fatigue and weight loss.  HENT: Negative for congestion.   Eyes: Negative for blurred vision.  Respiratory: Negative for cough, hemoptysis, sputum production, shortness of breath and wheezing.   Cardiovascular: Negative for chest pain, palpitations, orthopnea, claudication, leg swelling and PND.  Gastrointestinal: Negative for blood in stool, heartburn and vomiting.  Genitourinary: Negative.   Musculoskeletal: Negative.   Neurological: Positive for dizziness, weakness and headaches. Negative for seizures.  Endo/Heme/Allergies: Does not bruise/bleed  easily.  Psychiatric/Behavioral: Negative.    Objective:   Vitals:   04/04/20 1128  BP: 130/90  Pulse: 80  Temp: (!) 97.4 F (36.3 C)  TempSrc: Temporal  SpO2: 97%  Weight: 226 lb 6.4 oz (102.7 kg)  Height: 5\' 7"  (1.702 m)     Physical Exam Constitutional:      Appearance: Normal appearance. He is obese. He is not ill-appearing.  HENT:     Head: Normocephalic and atraumatic.     Nose: Nose normal.     Mouth/Throat:     Mouth: Mucous membranes are moist.     Pharynx: Oropharynx is clear.  Eyes:     General: No scleral icterus.    Conjunctiva/sclera: Conjunctivae normal.     Pupils: Pupils are equal, round, and reactive to light.  Cardiovascular:  Rate and Rhythm: Normal rate and regular rhythm.     Pulses: Normal pulses.     Heart sounds: Normal heart sounds. No murmur heard.   Pulmonary:     Effort: Pulmonary effort is normal.     Breath sounds: Normal breath sounds. No wheezing or rales.  Abdominal:     General: Bowel sounds are normal.     Palpations: Abdomen is soft.  Musculoskeletal:     Cervical back: Neck supple.     Right lower leg: No edema.     Left lower leg: No edema.  Lymphadenopathy:     Cervical: No cervical adenopathy.  Skin:    General: Skin is warm and dry.  Neurological:     General: No focal deficit present.     Mental Status: He is alert and oriented to person, place, and time. Mental status is at baseline.  Psychiatric:        Mood and Affect: Mood normal.        Behavior: Behavior normal.        Thought Content: Thought content normal.        Judgment: Judgment normal.     CBC No results found for: WBC, RBC, HGB, HCT, PLT, MCV, MCH, MCHC, RDW, LYMPHSABS, MONOABS, EOSABS, BASOSABS   Chest imaging: No imaging on file  PFT: No PFT on file  Labs: No Labs on file  Echo: 09/20/19 1. Left ventricular ejection fraction, by estimation, is 60 to 65%. The  left ventricle has normal function. The left ventricle has no regional   wall motion abnormalities. There is mild concentric left ventricular  hypertrophy. Left ventricular diastolic  parameters were normal.  2. Right ventricular systolic function is normal. The right ventricular  size is normal. There is normal pulmonary artery systolic pressure.  3. The mitral valve is normal in structure. No evidence of mitral valve  regurgitation. No evidence of mitral stenosis.  4. The aortic valve is tricuspid. Aortic valve regurgitation is not  visualized. No aortic stenosis is present.  5. The inferior vena cava is normal in size with greater than 50%  respiratory variability, suggesting right atrial pressure of 3 mmHg.  6. Agitated saline contrast bubble study was negative, with no evidence  of any interatrial shunt.  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   He has been diagnosed with obstructive sleep apnea which is likely leading to his symptoms of dyspnea and headaches. He remains untreated at this time and is awaiting CPAP setup [facility].   Instructed him to follow up with Dr. Blenda [facility]hodri at [facility] and that he does not need to follow up with us unless he would like us to take over his case. There is no need for him to be followed at [facility] and our office.   Follow up as needed.  Melody ComasJonathan Alekhya Gravlin, MD [facility] Office: [phone]        Current Outpatient Medications:  .  albuterol (VENTOLIN HFA) 108 (90 Base) MCG/ACT inhaler, Inhale into the lungs., Disp: , Rfl:  .  fexofenadine (ALLEGRA) 180 MG tablet, Take 180 mg by mouth daily., Disp: , Rfl:  .  ondansetron (ZOFRAN ODT) 4 MG disintegrating tablet, Take 1 tablet (4 mg total) by mouth every 8 (eight) hours  as needed for nausea  or vomiting., Disp: 20 tablet, Rfl: 5 .  Rimegepant Sulfate (NURTEC) 75 MG TBDP, Take 75 mg by mouth daily as needed., Disp: 16 tablet, Rfl: 5 .  topiramate (TOPAMAX) 50 MG tablet, Take 1 tablet (50 mg total) by mouth at bedtime., Disp: 30 tablet, Rfl: 5

## 2020-04-06 ENCOUNTER — Encounter: Payer: Self-pay | Admitting: Pulmonary Disease

## 2020-05-12 NOTE — Progress Notes (Signed)
NEUROLOGY FOLLOW UP OFFICE NOTE  Ivan Lowe [id number]  Assessment/Plan:   1.  Recurrent near-syncope/syncope with headache.  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  Paresthesias of left upper and lower extremities- possibly two separate entities (cervical radiculopathy and lumbar radiculopathy), however given that symptoms started simultaneously, would first evaluate for cervical myelopathy, which may cause both symptoms.  Unlikely to be secondary to topiramate as he has been off of it for 2 weeks. 3.  OSA  1.  It appears that I did refill his topiramate a week ago. Unsure why he was unable to get it filled.  Will refill topiramate 50mg  at bedtime again 2.  For rescue, Nurtec - it was effective. Sent in a refill last month too.  Advised to contact his pharmacy to make sure it wasn't filled.  Otherwise, I can resend it, but would send to [facility] pharmacy. 3.  Will check MRI of cervical spine. If unremarkable, then NCV-EMG left upper and lower extremities.   4.  Continue management for OSA 5.  Follow up 4 to 6 months.  Subjective:  [facility]. 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  UPDATE: MRI of brain and MRA head and neck on 12/16/2019 personally reviewed showed single nonspecifi punctate hyperintense FLAIR focus within the subcortical white matter of the anterior right frontal lobe, likely of no clinical significance, and normal MRA.  Started on topiramate in October and triptan discontinued as it would be contraindicated in basilar migraine.  He was prescribed Nurtec for rescue. Headaches and photosensitivity improved.  Headaches decreased to 1 headache a week (from 3 to 4 a week). He reports near-syncope episodes about every other week (down from every 2 days with syncope).  He was diagnosed with OSA and has been on CPAP for a little over a month.  He reports some benefit on the machine but still has difficulty  falling asleep.  He also reports numbness and tingling.  He reports numbing waves from left shoulder down to elbow, along the tricep.  It typically lasts 5-10 seconds.  He also reports left leg numbness lasting 5-15 minutes (sometimes longer). There is associated pins and needles sensation.  Extremities feels week but relates it to the sensory symptoms (not actual weakness).  No neck pain but reports a "knot" on the back of neck for years.  He reports vague symptoms in the past, associated with exercise but has been occurring daily.  Twice a week, he reports stiffness across the lower back.  When he gets out of bed, he is flexed and needs to take a minute to stand erect.    Current migraine rescue:  Nurtec Current migraine preventative:  topiramate 50mg  at bedtime    HISTORY: He has had recurrent syncopal events since April 2021.  He describes feeling of diaphoresis, head pressure, chest tightness, SOB, lightheadedness and blurred vision or spots in his vision.  This usually lasts 5 to 15 minutes.  It is not positional.  He needs to sit down to prevent from passing out.  He has passed out twice.  This is followed by a severe throbbing frontal headache associated with nausea, photophobia, and phonophobia but no numbness or unilateral weakness.  Headache lasts 1 to 2 hours.  Afterwards, he has extreme fatigue and diffuse weakness, sometimes up to several days.  Blood pressure during events range from 160/94 to 273/101.  Blood sugars were normal.  No specific trigger or  preceding event.  Sometimes laying down in a dark and quiet room helps relieve some symptoms.  He was evaluated by cardiology in July.   Echocardiogram in August was normal with EF 60-65% with negative bubble study.  Cardiac event monitor showed sinus to sinus tachycardia with events as well as some asymptomatic junctional rhythm with some pauses up to 3.6 seconds but no significant arrhythmia to correlate with is events.  Cardiac etiology was  ruled out.    He has poor sleep with chronic insomnia.  He sleeps no more than 2 to 3 hours and sometimes goes several days without sleep.  He saw a sleep specialist once when he was 42 or 30 years old and was prescribed melatonin 30mg  which was ineffective.  He has history of migraines in childhood.  He has significant psychiatric family history.  Maybe his mother had headaches.  Past rescue medications:  Sumatriptan 50mg  (contraindicted in basilar migraine), ibuprofen  PAST MEDICAL HISTORY: Past Medical History:  Diagnosis Date  . Boxer's fracture   . 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 Heart murmur   . Mixed hyperlipidemia 08/30/2019  . Obesity (BMI 30-39.9) 08/30/2019  . Palpitations 08/30/2019  . Scoliosis   . Shortness of breath 08/30/2019  . Syncope and collapse 08/30/2019    MEDICATIONS: Current Outpatient Medications on File Prior to Visit  Medication Sig Dispense Refill  . albuterol (VENTOLIN HFA) 108 (90 Base) MCG/ACT inhaler Inhale into the lungs.    . fexofenadine (ALLEGRA) 180 MG tablet Take 180 mg by mouth daily.    . ondansetron (ZOFRAN ODT) 4 MG disintegrating tablet Take 1 tablet (4 mg total) by mouth every 8 (eight) hours as needed for nausea or vomiting. 20 tablet 5  . Rimegepant Sulfate (NURTEC) 75 MG TBDP Take 75 mg by mouth daily as needed. 16 tablet 5  . topiramate (TOPAMAX) 50 MG tablet Take 1 tablet (50 mg total) by mouth at bedtime. 30 tablet 5   No current facility-administered medications on file prior to visit.    ALLERGIES: Allergies  Allergen Reactions  . Flonase [Fluticasone Propionate] Other (See Comments)    Caused nose bleeds    FAMILY HISTORY: Family History  Problem Relation Age of Onset  . Heart disease Mother   . Hypertension Mother   . Heart disease Maternal Grandmother       Objective:  Blood pressure 125/90, pulse 82, resp. rate 18, height 5\' 7"  (1.702 m), weight 229 lb (103.9 kg), SpO2 98 %. General: No acute distress.   Patient appears well-groomed.   Head:  Normocephalic/atraumatic Eyes:  Fundi examined but not visualized Neck: supple, no paraspinal tenderness, full range of motion Heart:  Regular rate and rhythm Lungs:  Clear to auscultation bilaterally Back: No paraspinal tenderness Neurological Exam: alert and oriented to person, place, and time. Attention span and concentration intact, recent and remote memory intact, fund of knowledge intact.  Speech fluent and not dysarthric, language intact.  CN II-XII intact. Bulk and tone normal, muscle strength 5/5 throughout.  Sensation to pinprick reduced in left posterior upper arm and dorsal forearm; reduced in left latera thigh and shin.  Vibratory sensation intact.  Deep tendon reflexes 2+ throughout, toes downgoing.  Finger to nose and heel to shin testing intact.  Gait normal, Romberg negative.     09/01/2019, DO  CC:  09/01/2019, NP

## 2020-05-13 ENCOUNTER — Ambulatory Visit (INDEPENDENT_AMBULATORY_CARE_PROVIDER_SITE_OTHER): Payer: Managed Care, Other (non HMO) | Admitting: Neurology

## 2020-05-13 ENCOUNTER — Other Ambulatory Visit: Payer: Self-pay

## 2020-05-13 ENCOUNTER — Encounter: Payer: Self-pay | Admitting: Neurology

## 2020-05-13 VITALS — BP 125/90 | HR 82 | Resp 18 | Ht 67.0 in | Wt 229.0 lb

## 2020-05-13 DIAGNOSIS — G4733 Obstructive sleep apnea (adult) (pediatric): Secondary | ICD-10-CM

## 2020-05-13 DIAGNOSIS — R200 Anesthesia of skin: Secondary | ICD-10-CM

## 2020-05-13 DIAGNOSIS — G43109 Migraine with aura, not intractable, without status migrainosus: Secondary | ICD-10-CM

## 2020-05-13 MED ORDER — TOPIRAMATE 50 MG PO TABS
50.0000 mg | ORAL_TABLET | Freq: Every day | ORAL | 5 refills | Status: DC
Start: 2020-05-13 — End: 2022-11-30

## 2020-05-13 NOTE — Patient Instructions (Addendum)
1.   Refilled topiramate 50mg  at bedtime 2.  Contact [facility] to see if the Nurtec was received.  If not, I will send it to a mail order pharmacy, [facility] 3.  Will check MRI of cervical spine with and without contrast 4.  Follow up 4 to 6 months.  We have sent a referral to [facility] for your MRI and they will call you directly to schedule your appointment. They are located at [address] [facility]. If you need to contact them directly please call [phone].

## 2020-06-01 ENCOUNTER — Ambulatory Visit
Admission: RE | Admit: 2020-06-01 | Discharge: 2020-06-01 | Disposition: A | Discharge status: Home / Self Care | Payer: Managed Care, Other (non HMO) | Source: Ambulatory Visit | Attending: Neurology | Admitting: Neurology

## 2020-06-01 ENCOUNTER — Other Ambulatory Visit: Payer: Self-pay

## 2020-06-01 IMAGING — MR MR CERVICAL SPINE WO/W CM
6 of 8 series · 31 of 48 positions shown · IV contrast (multihance)
Comparison: None available.

CLINICAL DATA: Initial evaluation for left-sided weakness, numbness
extending into the left upper extremity, bilateral leg pain.

EXAM:
MRI CERVICAL SPINE WITHOUT AND WITH CONTRAST
TECHNIQUE: Multiplanar and multiecho pulse sequences of the cervical spine, to
include the craniocervical junction and cervicothoracic junction,
were obtained without and with intravenous contrast.
CONTRAST:  20mL MULTIHANCE GADOBENATE DIMEGLUMINE 529 MG/ML IV SOLN

[Series 2: T1 · sagittal · 3.0mm · 0.82mm/px · 5 of 19 slices shown (1 of 2)]
[im 1/19]
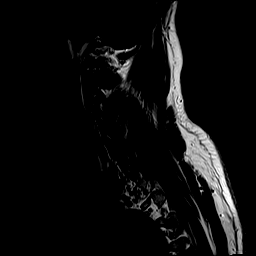
[im 5/19]
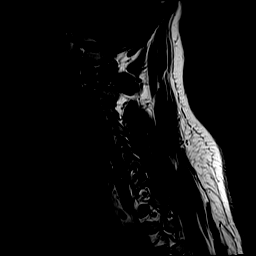
[im 10/19]
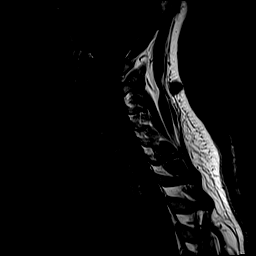
[im 14/19]
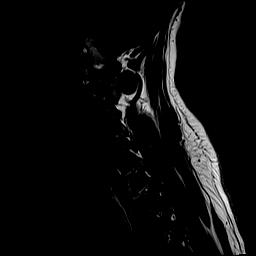
[im 19/19]
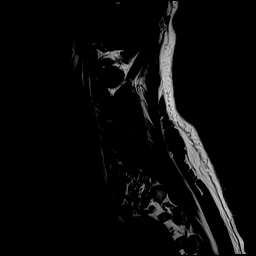

[Series 3: STIR · sagittal · 3.0mm · 0.82mm/px · 2 of 19 slices shown]
[im 1/19]
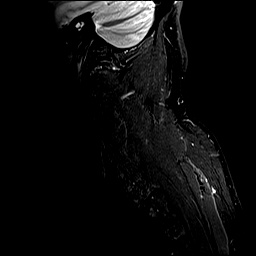
[im 5/19]
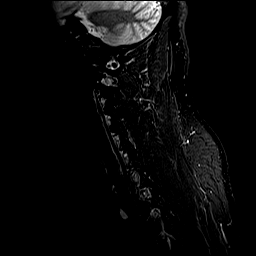

[Series 4: T2 · axial · 3.0mm · 0.70mm/px · z∈[-82,+27]mm · 7 of 31 slices shown (1 of 2)]
[im 1/31]
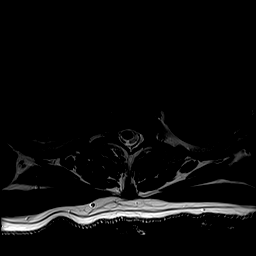
[im 6/31]
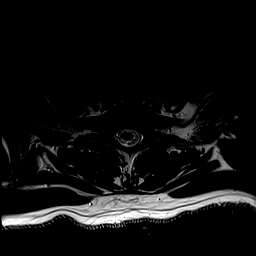
[im 11/31]
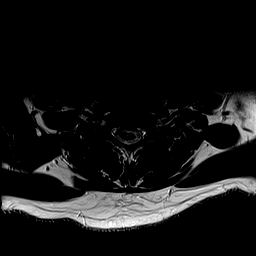
[im 16/31]
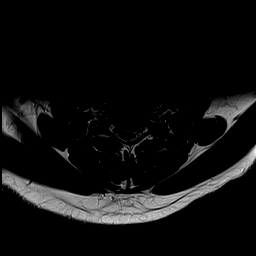
[im 21/31]
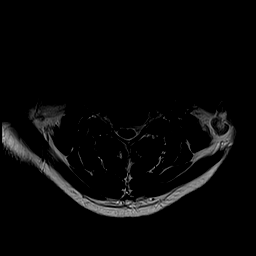
[im 26/31]
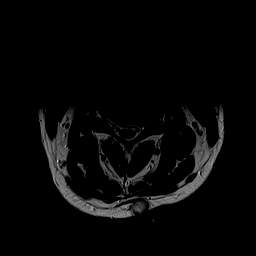
[im 31/31]
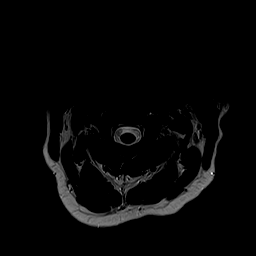

[Series 6: T1 · axial · 3.0mm · 0.35mm/px · z∈[-82,+27]mm · 7 of 31 slices shown (2 of 2)]
[im 1/31]
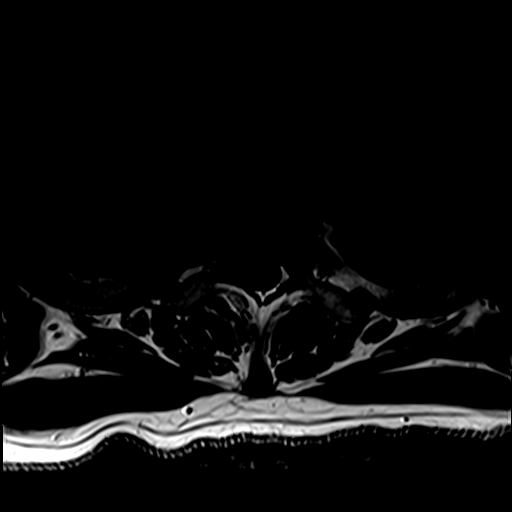
[im 6/31]
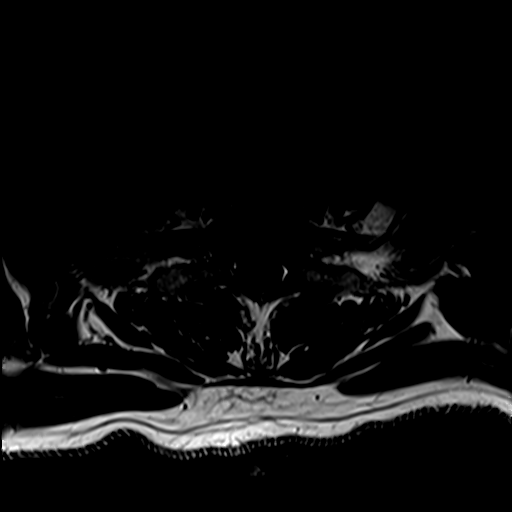
[im 11/31]
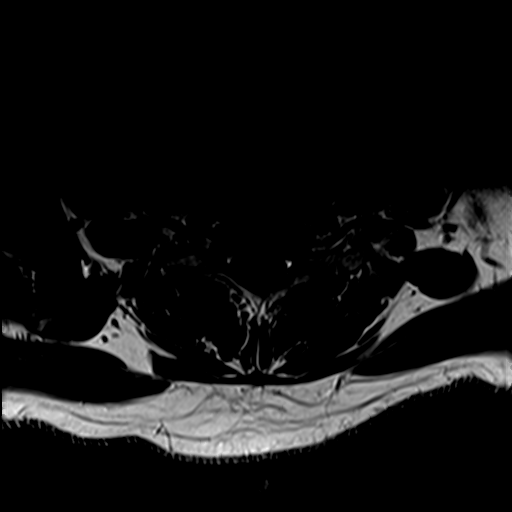
[im 16/31]
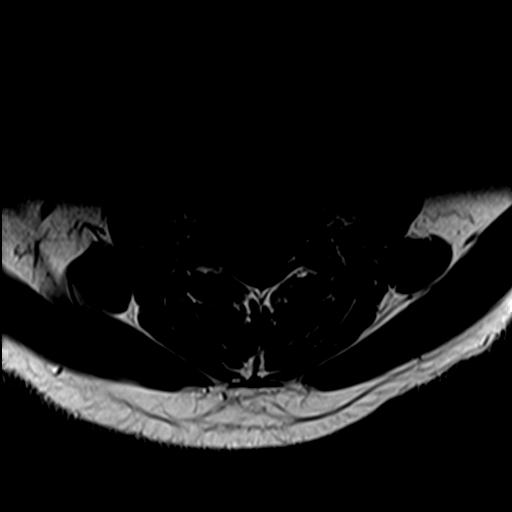
[im 21/31]
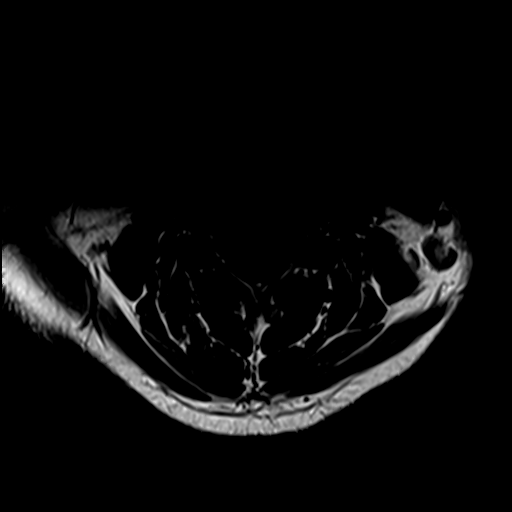
[im 26/31]
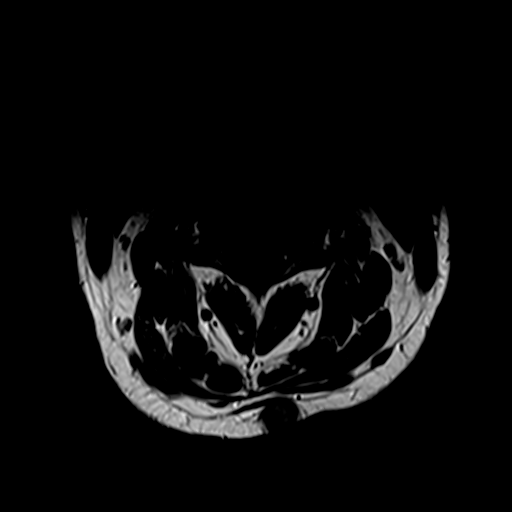
[im 31/31]
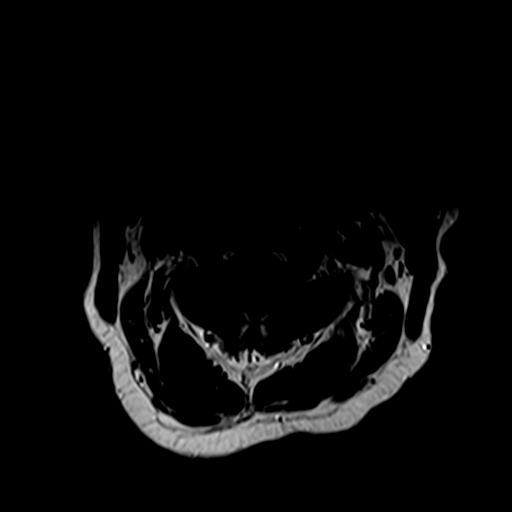

[Series 7: T2 · sagittal · 3.0mm · 0.41mm/px · 5 of 19 slices shown (2 of 2)]
[im 1/19]
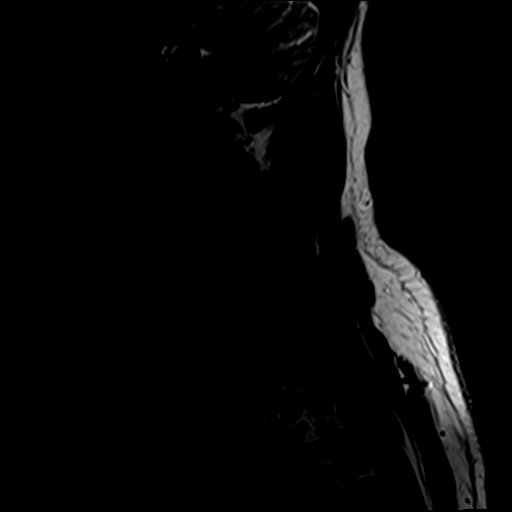
[im 5/19]
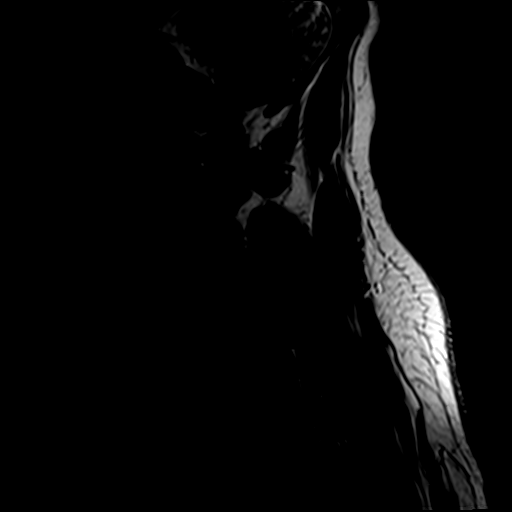
[im 10/19]
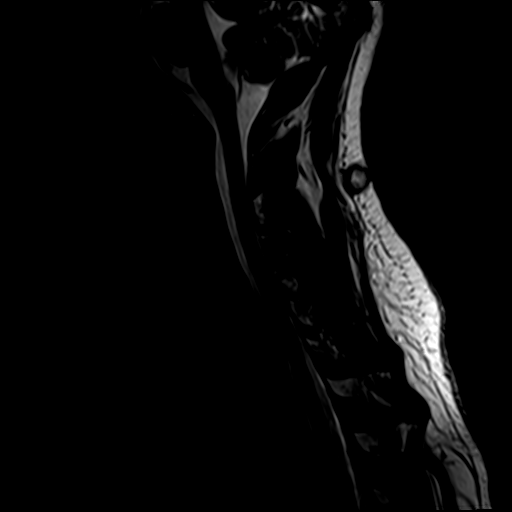
[im 14/19]
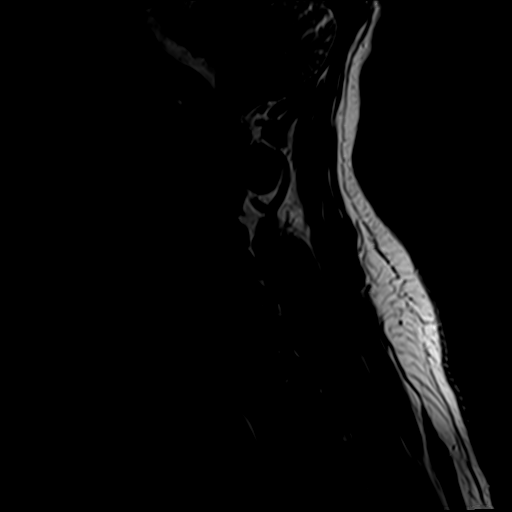
[im 19/19]
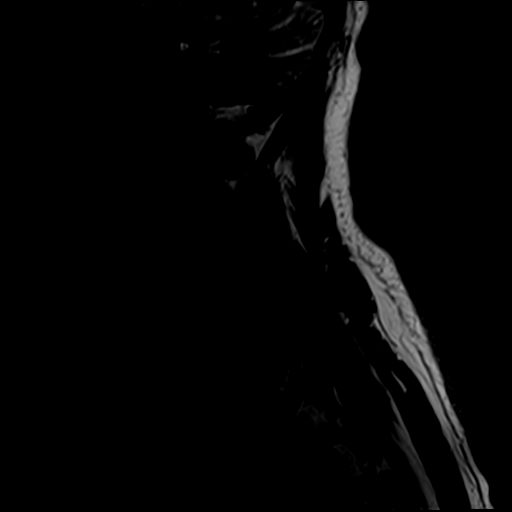

[Series 8: T1 fat-sat post-contrast · sagittal · 3.0mm · 0.82mm/px · 5 of 19 slices shown]
[im 1/19]
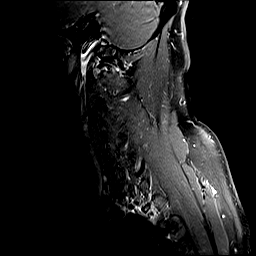
[im 5/19]
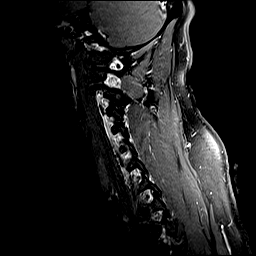
[im 10/19]
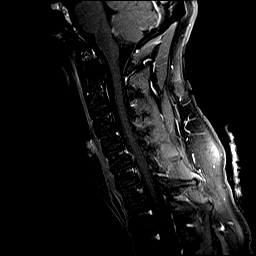
[im 14/19]
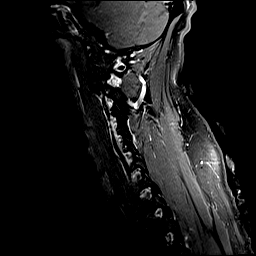
[im 19/19]
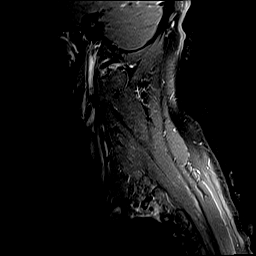

[31 of 48 positions shown; findings below may reference images not displayed]

FINDINGS: Alignment: Straightening with mild reversal of the normal cervical
lordosis. No listhesis.

Vertebrae: Vertebral body height well maintained without acute or
chronic fracture. Bone marrow signal intensity within normal limits.
No discrete or worrisome osseous lesions. No abnormal marrow edema
or enhancement.

Cord: Normal signal and morphology.  No abnormal enhancement.

Posterior Fossa, vertebral arteries, paraspinal tissues: Visualized
brain and posterior fossa within normal limits. Craniocervical
junction normal. 1.4 x 1.4 cm teardrop shaped cystic lesion
positioned within the subcutaneous fat just to the left of midline
at the level of C2-3 noted, nonspecific, but suspected to reflect a
small sebaceous cyst. Paraspinous and prevertebral soft tissues
demonstrate no other significant finding. Normal flow voids seen
within the vertebral arteries bilaterally.

Disc levels:

C2-C3: Mild uncovertebral spurring without significant disc bulge,
greater on the right. Mild left greater than right facet
degeneration. No spinal stenosis. Foramina remain patent.

C3-C4: Mild uncovertebral hypertrophy without significant disc
bulge. No spinal stenosis. Moderate left C4 foraminal stenosis.
Right neural foramina remains patent.

C4-C5: Normal interspace. Mild left-sided facet hypertrophy. No
canal or foraminal stenosis.

C5-C6: Broad-based and mildly lobulated left paracentral disc
protrusion flattens and effaces the ventral thecal sac, asymmetric
to the left (series 4, image 15). Secondary flattening of the left
ventral cord without cord signal changes. Mild spinal stenosis with
moderate left C6 foraminal narrowing. Right neural foramina remains
patent. Finding could contribute to left-sided radicular symptoms.

C6-C7: Disc desiccation. Central to right foraminal disc protrusion
flattens and partially effaces the ventral thecal sac (series 4,
image 19). Mild spinal stenosis without frank cord impingement.
Foramina remain patent.

C7-T1:  Unremarkable.

Visualized upper thoracic spine demonstrates no significant finding.
IMPRESSION: 1. Broad-based left paracentral disc protrusion at C5-6 with
resultant mild spinal stenosis and moderate left C6 foraminal
narrowing. Finding could contribute to left-sided radicular
symptoms.
2. Central to right foraminal disc protrusion at C6-7 with resultant
mild spinal stenosis.
3. Moderate left C4 foraminal stenosis related to uncovertebral and
facet disease.
4. Normal MRI appearance of the cervical spinal cord. No evidence
for demyelinating disease or other abnormality.

## 2020-06-01 MED ORDER — GADOBENATE DIMEGLUMINE 529 MG/ML IV SOLN
20.0000 mL | Freq: Once | INTRAVENOUS | Status: AC | PRN
  Administered 2020-06-01: 20 mL via INTRAVENOUS

## 2020-06-02 ENCOUNTER — Other Ambulatory Visit: Payer: Self-pay

## 2020-06-02 DIAGNOSIS — R202 Paresthesia of skin: Secondary | ICD-10-CM

## 2020-06-03 ENCOUNTER — Other Ambulatory Visit: Payer: Managed Care, Other (non HMO)

## 2020-07-30 ENCOUNTER — Other Ambulatory Visit: Payer: Self-pay

## 2020-07-30 ENCOUNTER — Ambulatory Visit (INDEPENDENT_AMBULATORY_CARE_PROVIDER_SITE_OTHER): Payer: Managed Care, Other (non HMO) | Admitting: Neurology

## 2020-07-30 DIAGNOSIS — R202 Paresthesia of skin: Secondary | ICD-10-CM

## 2020-07-30 NOTE — Procedures (Signed)
[facility]  [address] [place], [address]  [place], [place] [address] Tel: [phone] Fax:  [phone] Test Date:  07/30/2020  Patient: Ivan Lowe DOB: 07-28-1990 Physician: Nita Sickle, DO  Sex: Male Height: 5\' 6"  Ref Phys: , D.O.  ID#: Ivan Lowe   Technician:    Patient Complaints: This is a 29 year old man referred for evaluation of left arm and leg numbness and tingling.  NCV & EMG Findings: Extensive electrodiagnostic testing of the left upper and lower extremity shows:  Left median, ulnar, mixed palmar, sural, and superficial peroneal sensory responses are within normal limits. Left median, ulnar, peroneal, and tibial motor responses are within normal limits. Left tibial H reflex study is within normal limits. There is no evidence of active or chronic motor axonal loss changes affecting any of the tested muscles.  Motor unit configuration and recruitment pattern is within normal limits.  Impression: This is a normal study of the left upper and lower extremities.  In particular, there is no evidence of a sensorimotor polyneuropathy or cervical/lumbosacral radiculopathy.   ___________________________ 37, DO    Nerve Conduction Studies Anti Sensory Summary Table   Stim Site NR Peak (ms) Norm Peak (ms) P-T Amp (V) Norm P-T Amp  Left Median Anti Sensory (2nd Digit)  33C  Wrist    2.8 <3.3 49.0 >20  Left Sup Peroneal Anti Sensory (Ant Lat Mall)  33C  12 cm    2.6 <4.4 10.5 >6  Left Sural Anti Sensory (Lat Mall)  33C  Calf    2.8 <4.4 19.9 >6  Left Ulnar Anti Sensory (5th Digit)  33C  Wrist    2.6 <3.0 35.4 >18   Motor Summary Table   Stim Site NR Onset (ms) Norm Onset (ms) O-P Amp (mV) Norm O-P Amp Site1 Site2 Delta-0 (ms) Dist (cm) Vel (m/s) Norm Vel (m/s)  Left Median Motor (Abd Poll Brev)  33C  Wrist    3.0 <3.9 13.1 >6 Elbow Wrist 4.5 29.0 64 >51  Elbow    7.5  13.1         Left Peroneal Motor (Ext Dig Brev)  33C   Ankle    4.0 <5.5 4.9 >3 B Fib Ankle 6.5 36.0 55 >41  B Fib    10.5  4.9  Poplt B Fib 1.5 8.0 53 >41  Poplt    12.0  4.9         Left Tibial Motor (Abd Hall Brev)  33C  Ankle    3.0 <5.8 23.4 >8 Knee Ankle 7.9 41.0 52 >41  Knee    10.9  15.4         Left Ulnar Motor (Abd Dig Minimi)  33C  Wrist    2.0 <3.0 15.7 >8 B Elbow Wrist 3.7 21.0 57 >51  B Elbow    5.7  15.5  A Elbow B Elbow 1.9 10.0 53 >51  A Elbow    7.6  15.2          Comparison Summary Table   Stim Site NR Peak (ms) Norm Peak (ms) P-T Amp (V) Site1 Site2 Delta-P (ms) Norm Delta (ms)  Left Median/Ulnar Palm Comparison (Wrist - 8cm)  33C  Median Palm    1.5 <2.2 93.6 Median Palm Ulnar Palm 0.2   Ulnar Palm    1.3 <2.2 28.4       H Reflex Studies   NR H-Lat (ms) Lat Norm (ms) L-R H-Lat (ms)  Left Tibial (Gastroc)  33C     29.25 <35    EMG   Side Muscle Ins Act Fibs Psw Fasc Number Recrt Dur Dur. Amp Amp. Poly Poly. Comment  Left 1stDorInt Nml Nml Nml Nml Nml Nml Nml Nml Nml Nml Nml Nml N/A  Left AntTibialis Nml Nml Nml Nml Nml Nml Nml Nml Nml Nml Nml Nml N/A  Left Gastroc Nml Nml Nml Nml Nml Nml Nml Nml Nml Nml Nml Nml N/A  Left Flex Dig Long Nml Nml Nml Nml Nml Nml Nml Nml Nml Nml Nml Nml N/A  Left RectFemoris Nml Nml Nml Nml Nml Nml Nml Nml Nml Nml Nml Nml N/A  Left GluteusMed Nml Nml Nml Nml Nml Nml Nml Nml Nml Nml Nml Nml N/A  Left PronatorTeres Nml Nml Nml Nml Nml Nml Nml Nml Nml Nml Nml Nml N/A  Left Biceps Nml Nml Nml Nml Nml Nml Nml Nml Nml Nml Nml Nml N/A  Left Triceps Nml Nml Nml Nml Nml Nml Nml Nml Nml Nml Nml Nml N/A  Left Deltoid Nml Nml Nml Nml Nml Nml Nml Nml Nml Nml Nml Nml N/A      Waveforms:

## 2020-07-31 NOTE — Progress Notes (Signed)
Left message to call office to speak with Alessa Mazur or [facility]

## 2020-08-01 NOTE — Progress Notes (Signed)
LMOVM to call the office back.

## 2020-08-05 ENCOUNTER — Telehealth: Payer: Self-pay | Admitting: Neurology

## 2020-08-05 DIAGNOSIS — M5412 Radiculopathy, cervical region: Secondary | ICD-10-CM

## 2020-08-05 DIAGNOSIS — M5416 Radiculopathy, lumbar region: Secondary | ICD-10-CM

## 2020-08-05 NOTE — Telephone Encounter (Signed)
Called to get test results, [phone]

## 2020-08-06 NOTE — Telephone Encounter (Signed)
Trey Paula called again regarding his results. York Spaniel he has called a few times. [phone]

## 2020-08-06 NOTE — Telephone Encounter (Signed)
Pt advised of EMG results and PT refill.

## 2020-11-06 NOTE — Progress Notes (Deleted)
NEUROLOGY FOLLOW UP OFFICE NOTE  Ivan Lowe [id number]  Assessment/Plan:   Recurrent near-syncope/syncope with headache.  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  Unlikely to be secondary to topiramate as he has been off of it for 2 weeks. OSA  Migraine prevention:  *** Migraine rescue:  *** Limit use of pain relievers to no more than 2 days out of week to prevent risk of rebound or medication-overuse headache. 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   UPDATE: Headaches occur once a week Near-syncopal spells occur once every other week.  Underwent workup for right upper and lower extremity numbness and tingling.  MRI of cervical spine with and without contrast on 06/01/2020 personally reviewed showed broad-based left paracentral disc protrusion at C5-6 causing mild spinal and moderate left C6 foraminal stenosis and uncovertebral and facet disease causing moderate left C4 foraminal stenosis.  NCV-EMG of left upper and lower extremities on 07/30/2020 was normal.  Advised referral to physical therapy for cervical and lumbar radiculopathy.  ***   Current migraine rescue:  Nurtec Current migraine preventative:  topiramate 50mg  at bedtime  HISTORY: I  Syncope/Basilar Migraine: He has had recurrent syncopal events since April 2021.  He describes feeling of diaphoresis, head pressure, chest tightness, SOB, lightheadedness and blurred vision or spots in his vision.  This usually lasts 5 to 15 minutes.  It is not positional.  He needs to sit down to prevent from passing out.  He has passed out twice.  This is  followed by a severe throbbing frontal headache associated with nausea, photophobia, and phonophobia but no numbness or unilateral weakness.  Headache lasts 1 to 2 hours.  Afterwards, he has extreme fatigue and diffuse weakness, sometimes up to several days.  Blood pressure during events range from 160/94 to 273/101.  Blood sugars were normal.  No specific trigger or preceding event.  Sometimes laying down in a dark and quiet room helps relieve some symptoms.  He was evaluated by cardiology in July.   Echocardiogram in August was normal with EF 60-65% with negative bubble study.  Cardiac event monitor showed sinus to sinus tachycardia with events as well as some asymptomatic junctional rhythm with some pauses up to 3.6 seconds but no significant arrhythmia to correlate with is events.  Cardiac etiology was ruled out.   MRI of brain and MRA head and neck on 12/16/2019 showed single nonspecifi punctate hyperintense FLAIR focus within the subcortical white matter of the anterior right frontal lobe, likely of no clinical significance, and normal MRA.  III Paresthesias:  He reports numbing waves from left shoulder down to elbow, along the tricep.  It typically lasts 5-10 seconds.  He also reports left leg numbness lasting 5-15 minutes (sometimes longer). There is associated pins and needles sensation.  Extremities feels week but relates it to the sensory symptoms (not actual weakness).  No neck pain but reports a "knot" on the back of neck for years.  He reports vague symptoms in the past, associated with exercise but has been occurring daily.  Twice a week, he reports stiffness across the lower back.  When he gets out of bed, he is flexed and needs to  take a minute to stand erect.     He has history of migraines in childhood.  He has significant psychiatric family history.  Maybe his mother had headaches.   Past rescue medications:  Sumatriptan 50mg  (contraindicted in basilar migraine), ibuprofen  PAST MEDICAL  HISTORY: Past Medical History:  Diagnosis Date   Boxer's fracture    Complicated migraine 08/30/2019   Heart murmur    Mixed hyperlipidemia 08/30/2019   Obesity (BMI 30-39.9) 08/30/2019   Palpitations 08/30/2019   Scoliosis    Shortness of breath 08/30/2019   Syncope and collapse 08/30/2019    MEDICATIONS: Current Outpatient Medications on File Prior to Visit  Medication Sig Dispense Refill   albuterol (VENTOLIN HFA) 108 (90 Base) MCG/ACT inhaler Inhale into the lungs.     fexofenadine (ALLEGRA) 180 MG tablet Take 180 mg by mouth daily.     ondansetron (ZOFRAN ODT) 4 MG disintegrating tablet Take 1 tablet (4 mg total) by mouth every 8 (eight) hours as needed for nausea or vomiting. 20 tablet 5   Rimegepant Sulfate (NURTEC) 75 MG TBDP Take 75 mg by mouth daily as needed. 16 tablet 5   topiramate (TOPAMAX) 50 MG tablet Take 1 tablet (50 mg total) by mouth at bedtime. 30 tablet 5   No current facility-administered medications on file prior to visit.    ALLERGIES: Allergies  Allergen Reactions   Flonase [Fluticasone Propionate] Other (See Comments)    Caused nose bleeds    FAMILY HISTORY: Family History  Problem Relation Age of Onset   Heart disease Mother    Hypertension Mother    Heart disease Maternal Grandmother       Objective:  *** General: No acute distress.  Patient appears ***-groomed.   Head:  Normocephalic/atraumatic Eyes:  Fundi examined but not visualized Neck: supple, no paraspinal tenderness, full range of motion Heart:  Regular rate and rhythm Lungs:  Clear to auscultation bilaterally Back: No paraspinal tenderness Neurological Exam: alert and oriented to person, place, and time.  Speech fluent and not dysarthric, language intact.  CN II-XII intact. Bulk and tone normal, muscle strength 5/5 throughout.  Sensation to light touch intact.  Deep tendon reflexes 2+ throughout, toes downgoing.  Finger to nose testing intact.  Gait normal, Romberg negative.   09/01/2019, DO  CC: ***

## 2020-11-07 ENCOUNTER — Ambulatory Visit: Payer: Managed Care, Other (non HMO) | Admitting: Neurology

## 2021-03-20 ENCOUNTER — Telehealth: Payer: Self-pay

## 2021-03-20 NOTE — Telephone Encounter (Signed)
New message  Your information has been submitted and will be reviewed by [place]. You may close this dialog, return to your dashboard, and perform other tasks. An electronic determination will be received in CoverMyMeds within 72-120 hours. You can see the latest determination by locating this request on your dashboard or by reopening this request. You will receive a fax copy of the determination. If Rosann Auerbach has not responded in 120 hours, contact [employer] at [phone].  [place]: [id number] - PA Case ID: [id number] Need help? Call us at [phone] Status Sent to [facility] Nurtec 75MG  dispersible tablets Form [place] Form [phone] NCPDP)

## 2021-04-07 NOTE — Telephone Encounter (Signed)
F/u  Receive fax from [facility]  Medication Nurtec 75 mg  Effective date 2.3.2023 to 2.21.2024

## 2021-07-02 ENCOUNTER — Other Ambulatory Visit (INDEPENDENT_AMBULATORY_CARE_PROVIDER_SITE_OTHER): Payer: Managed Care, Other (non HMO)

## 2021-07-02 ENCOUNTER — Encounter: Payer: Self-pay | Admitting: Gastroenterology

## 2021-07-02 ENCOUNTER — Ambulatory Visit (INDEPENDENT_AMBULATORY_CARE_PROVIDER_SITE_OTHER): Payer: Managed Care, Other (non HMO) | Admitting: Gastroenterology

## 2021-07-02 VITALS — BP 138/70 | HR 88 | Ht 67.0 in | Wt 231.0 lb

## 2021-07-02 DIAGNOSIS — K921 Melena: Secondary | ICD-10-CM

## 2021-07-02 DIAGNOSIS — K529 Noninfective gastroenteritis and colitis, unspecified: Secondary | ICD-10-CM

## 2021-07-02 LAB — CBC WITH DIFFERENTIAL/PLATELET
Basophils Absolute: 0.1 10*3/uL (ref 0.0–0.1)
Basophils Relative: 0.7 % (ref 0.0–3.0)
Eosinophils Absolute: 0.3 10*3/uL (ref 0.0–0.7)
Eosinophils Relative: 2.7 % (ref 0.0–5.0)
HCT: 45.4 % (ref 39.0–52.0)
Hemoglobin: 15.2 g/dL (ref 13.0–17.0)
Lymphocytes Relative: 33.1 % (ref 12.0–46.0)
Lymphs Abs: 3.3 10*3/uL (ref 0.7–4.0)
MCHC: 33.5 g/dL (ref 30.0–36.0)
MCV: 89.8 fl (ref 78.0–100.0)
Monocytes Absolute: 0.7 10*3/uL (ref 0.1–1.0)
Monocytes Relative: 7.4 % (ref 3.0–12.0)
Neutro Abs: 5.6 10*3/uL (ref 1.4–7.7)
Neutrophils Relative %: 56.1 % (ref 43.0–77.0)
Platelets: 324 10*3/uL (ref 150.0–400.0)
RBC: 5.06 Mil/uL (ref 4.22–5.81)
RDW: 12.7 % (ref 11.5–15.5)
WBC: 10 10*3/uL (ref 4.0–10.5)

## 2021-07-02 NOTE — Progress Notes (Signed)
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 I need to get those records.   Differential includes postinfectious IBS, chronic colitis including inflammatory bowel disease and microscopic colitis. Other causes of chronic diarrhea without alarm features include celiac disease, food intolerance or other functional GI disease. By history, this is less likely to be obstruction.  The differential for rectal bleeding may be due to a fissure or hemorrhoids, but, may also be explained by colitis, polyps, or mass.  Given this differential I am recommending a colonoscopy.   I do not think there is any benefit to another EGD at this time (mother specifically asked).    PLAN: - Fecal calprotectin, ESR, and CRP to screen for IBD - CBC - IgA tissue transglutaminase, IgA level to test for celiac disease - Avoid milk products, raw fruits, raw vegetables, high fat foods, artifical sweeteners, and carbonated beverages until symptoms resolve - Obtain records from [facility] from recent EGD and ultrasound - Colonoscopy with random biopsies and evaluation of the terminal ileum   HPI: Ivan Lowe is a 31 y.o. male referred by NP [facility] for further evaluation of persistent diarrhea after C. difficile.  The history is obtained through the patient, review of his electronic health record, and paper records provided by his referring provider.  He has COPD, asthma, migraines, lumbar  spondylosis, murmur as a child, and obesity who is seen in consultation for diarrhea. His mother accompanies him to this appointment. He works in Systems developer.   He had constipation as a child and would have to go to the hopsital for a clean out. Normal bowel habits have been a bowel movement every 3-4 days. Some straining but he always had a sense of complete. But, he notes the stool volume is large and requires double flushing the toilet.    He was treated for C. difficile 03/31/2021 with vancomycin that developed soon after antibiotics for a dental abscess.  However, he has had persistent diarrhea and abdominal pain since that time. Has even developed some rectal bleeding with blood in the bowel and on the toilet paper. There is intermittent bloating and early satiety. There has been some rectal pain and he felt a tearing sensation. Will have a BM daily but it is never fully formed. There is postdefecatory abdominal pain and often predefecatory cramping.   He was instructed to avoid noncaffeinated and sugar-free foods.  Denies trauma, close contacts with similar symptoms, changes in diet, recent travel.    He has been unable to work due to the diarrhea.   Stool testing negative for E. coli, Salmonella, Shigella, and Campylobacter 06/06/2021 C. difficile toxin, ova and parasites also negative Serum H. pylori antibodies negative Stool culture negative Lipase and amylase normal CBC normal CMP normal except for glucose of 115 TSH 1.03 Fecal occult card was negative in his PCPs office.  An abdominal ultrasound was normal (per patient report)  He had an EGD performed at [facility]  to evaluate the abdominal pain. No colonoscopy performed at that time. He reports having a gastric ulcer.   Mom with colon polyps on 5 colonoscopies! and IBS and is followed by Dr. Loletha Carrow. Maternal grandfather with colon polyps. Maternal grandmother with celiac.  There is no known family history of colon  cancer or polyps. No family history of stomach cancer or other GI malignancy. No family history of inflammatory bowel disease or celiac.    Past Medical History:  Diagnosis Date   Anemia    Asthma    Boxer's fracture    Complicated migraine 37/85/8850   COPD (chronic obstructive pulmonary disease) (HCC)    Heart murmur    Mixed hyperlipidemia 08/30/2019   Obesity (BMI 30-39.9) 08/30/2019   Palpitations 08/30/2019   Scoliosis    Shortness of breath 08/30/2019   Syncope and collapse 08/30/2019    Past Surgical History:  Procedure Laterality Date   FOOT SURGERY     LUNG SURGERY       Current Outpatient Medications  Medication Sig Dispense Refill   albuterol (VENTOLIN HFA) 108 (90 Base) MCG/ACT inhaler Inhale into the lungs. (Patient not taking: Reported on 07/02/2021)     fexofenadine (ALLEGRA) 180 MG tablet Take 180 mg by mouth daily. (Patient not taking: Reported on 07/02/2021)     Rimegepant Sulfate (NURTEC) 75 MG TBDP Take 75 mg by mouth daily as needed. (Patient not taking: Reported on 07/02/2021) 16 tablet 5   topiramate (TOPAMAX) 50 MG tablet Take 1 tablet (50 mg total) by mouth at bedtime. (Patient not taking: Reported on 07/02/2021) 30 tablet 5   No current facility-administered medications for this visit.    Allergies as of 07/02/2021 - Review Complete 07/02/2021  Allergen Reaction Noted   Flonase [fluticasone propionate] Other (See Comments) 05/19/2017    Family History  Problem Relation Age of Onset   Heart disease Mother    Hypertension Mother    Heart disease Maternal Grandmother    Stomach cancer Neg Hx    Colon cancer Neg Hx    Esophageal cancer Neg Hx     Social History   Socioeconomic History   Marital status: Single    Spouse name: Not on file   Number of children: Not on file   Years of education: Not on file   Highest education level: Not on file  Occupational History   Not on file  Tobacco Use   Smoking status: Never   Smokeless tobacco:  Never  Vaping Use   Vaping Use: Never used  Substance and Sexual Activity   Alcohol use: Yes    Comment: Once or twice a month    Drug use: Yes    Types: Marijuana    Comment: Last used: 2 weeks ago    Sexual activity: Not on file  Other Topics Concern   Not on file  Social History Narrative   Right handed   Social Determinants of Health   Financial Resource Strain: Not on file  Food Insecurity: Not on file  Transportation Needs: Not on file  Physical Activity: Not on file  Stress: Not on file  Social Connections: Not on file  Intimate Partner Violence: Not on file    Review of Systems: 12 system ROS is negative except as noted above.   Physical Exam: General:   Alert,  well-nourished, pleasant and cooperative in NAD Head:  Normocephalic and atraumatic. Eyes:  Sclera clear, no icterus.   Conjunctiva pink. Ears:  Normal auditory acuity. Nose:  No deformity, discharge,  or lesions. Mouth:  No deformity or lesions.   Neck:  Supple; no masses or thyromegaly. Lungs:  Clear throughout to auscultation.   No wheezes. Heart:  Regular rate and rhythm; no murmurs. Abdomen:  Soft, nontender, nondistended, normal bowel sounds, no rebound or guarding. No hepatosplenomegaly.   Rectal:  Deferred  Msk:  Symmetrical. No boney deformities LAD: No inguinal or umbilical LAD Extremities:  No clubbing or edema. Neurologic:  Alert and  oriented x4;  grossly nonfocal Skin:  Intact without significant lesions or rashes. Psych:  Alert and cooperative. Normal mood and affect.     Bella Brummet L. Tarri Glenn, MD, MPH 07/02/2021, 4:51 PM

## 2021-07-02 NOTE — Patient Instructions (Addendum)
It was my pleasure to provide care to you today. Based on our discussion, I am providing you with my recommendations below:  RECOMMENDATION(S):   Avoid milk products, raw fruits, raw vegetables, high fat foods, artifical sweeteners, and carbonated beverages until symptoms resolve  LABS:   Please proceed to the basement level for lab work before leaving today. Press "B" on the elevator. The lab is located at the first door on the left as you exit the elevator.  HEALTHCARE LAWS AND MY CHART RESULTS:   Due to recent changes in healthcare laws, you may see results of your imaging and/or laboratory studies on MyChart before I have had a chance to review them.  I understand that in some cases there may be results that are confusing or concerning to you. Please understand that not all results are received at the same time and often I may need to interpret multiple results in order to provide you with the best plan of care or course of treatment. Therefore, I ask that you please give me 48 hours to thoroughly review all your results before contacting my office for clarification.   COLONOSCOPY:   You have been scheduled for a colonoscopy. Please follow written instructions given to you at your visit today.   PREP:   Please pick up your prep supplies at the pharmacy within the next 1-3 days.   COLONOSCOPY TIPS:  To reduce nausea and dehydration, stay well hydrated for 3-4 days prior to the exam.  To prevent skin/hemorrhoid irritation - prior to wiping, put A&Dointment or vaseline on the toilet paper. Keep a towel or pad on the bed.  BEFORE STARTING YOUR PREP, drink  64oz of clear liquids in the morning. This will help to flush the colon and will ensure you are well hydrated!!!!  NOTE - This is in addition to the fluids required for to complete your prep. Use of a flavored hard candy, such as grape Rubin Payor, can counteract some of the flavor of the prep and may prevent some nausea.     FOLLOW UP:  After your procedure, you will receive a call from my office staff regarding my recommendation for follow up.  BMI:  If you are age 41 or older, your body mass index should be between 23-30. Your Body mass index is 36.18 kg/m. If this is out of the aforementioned range listed, please consider follow up with your Primary Care Provider.  If you are age 55 or younger, your body mass index should be between 19-25. Your Body mass index is 36.18 kg/m. If this is out of the aformentioned range listed, please consider follow up with your Primary Care Provider.   MY CHART:  The  GI providers would like to encourage you to use [facility] to communicate with providers for non-urgent requests or questions.  Due to long hold times on the telephone, sending your provider a message by [facility] may be a faster and more efficient way to get a response.  Please allow 48 business hours for a response.  Please remember that this is for non-urgent requests.   Thank you for trusting me with your gastrointestinal care!    Tressia Danas, MD, MPH

## 2021-07-03 ENCOUNTER — Encounter: Payer: Self-pay | Admitting: Gastroenterology

## 2021-07-03 ENCOUNTER — Ambulatory Visit (AMBULATORY_SURGERY_CENTER): Payer: Managed Care, Other (non HMO) | Admitting: Gastroenterology

## 2021-07-03 VITALS — BP 119/67 | HR 88 | Temp 98.6°F | Resp 20 | Ht 67.0 in | Wt 231.0 lb

## 2021-07-03 DIAGNOSIS — K648 Other hemorrhoids: Secondary | ICD-10-CM | POA: Diagnosis not present

## 2021-07-03 DIAGNOSIS — K529 Noninfective gastroenteritis and colitis, unspecified: Secondary | ICD-10-CM

## 2021-07-03 DIAGNOSIS — K625 Hemorrhage of anus and rectum: Secondary | ICD-10-CM

## 2021-07-03 LAB — TISSUE TRANSGLUTAMINASE ABS,IGG,IGA
(tTG) Ab, IgA: <1 U/mL
(tTG) Ab, IgG: <1 U/mL

## 2021-07-03 LAB — IGA: Immunoglobulin A: 85 mg/dL (ref 47–310)

## 2021-07-03 MED ORDER — AMBULATORY NON FORMULARY MEDICATION: 1.0000 | Freq: Four times a day (QID) | 1 refills | Status: AC

## 2021-07-03 MED ORDER — SODIUM CHLORIDE 0.9 % IV SOLN: 500.0000 mL | Freq: Once | INTRAVENOUS | Status: DC

## 2021-07-03 NOTE — Progress Notes (Signed)
Pt's states no medical or surgical changes since previsit or office visit. 

## 2021-07-03 NOTE — Op Note (Signed)
[facility] Patient Name: Ivan Lowe Procedure Date: 07/03/2021 11:25 AM MRN: [id number] Endoscopist: Tressia Danas MD, MD Age: 31 Referring MD:  Date of Birth: Apr 13, 1990 Gender: Male Account #: [id number] Procedure:                Colonoscopy Indications:              Chronic diarrhea, Rectal bleeding Medicines:                Monitored Anesthesia Care Procedure:                Pre-Anesthesia Assessment:                           - Prior to the procedure, a History and Physical                            was performed, and patient medications and                            allergies were reviewed. The patient's tolerance of                            previous anesthesia was also reviewed. The risks                            and benefits of the procedure and the sedation                            options and risks were discussed with the patient.                            All questions were answered, and informed consent                            was obtained. Prior Anticoagulants: The patient has                            taken no previous anticoagulant or antiplatelet                            agents. ASA Grade Assessment: II - A patient with                            mild systemic disease. After reviewing the risks                            and benefits, the patient was deemed in                            satisfactory condition to undergo the procedure.                           After obtaining informed consent, the colonoscope  was passed under direct vision. Throughout the                            procedure, the patient's blood pressure, pulse, and                            oxygen saturations were monitored continuously. The                            Olympus CF-HQ190L (Serial# [id number]) Colonoscope was                            introduced through the anus and advanced to the 10                            cm into the ileum.  The colonoscopy was performed                            without difficulty. The patient tolerated the                            procedure well. The quality of the bowel                            preparation was excellent. The terminal ileum,                            ileocecal valve, appendiceal orifice, and rectum                            were photographed. Scope In: 11:42:04 AM Scope Out: 11:54:36 AM Scope Withdrawal Time: 0 hours 10 minutes 27 seconds  Total Procedure Duration: 0 hours 12 minutes 32 seconds  Findings:                 The perianal and digital rectal examinations were                            normal.                           Non-bleeding internal hemorrhoids were found.                           The colon (entire examined portion) appeared                            normal. Biopsies were taken from the right colon,                            transverse colon, and left colon with a cold                            forceps for histology. Estimated blood loss was  minimal.                           The terminal ileum appeared normal. Biopsies were                            taken with a cold forceps for histology. Estimated                            blood loss was minimal.                           The exam was otherwise without abnormality on                            direct and retroflexion views. Complications:            No immediate complications. Estimated Blood Loss:     Estimated blood loss was minimal. Impression:               - Non-bleeding internal hemorrhoids.                           - The entire examined colon is normal. Biopsied.                           - The examined portion of the ileum was normal.                            Biopsied.                           - The examination was otherwise normal on direct                            and retroflexion views. Recommendation:           - Patient has a contact number  available for                            emergencies. The signs and symptoms of potential                            delayed complications were discussed with the                            patient. Return to normal activities tomorrow.                            Written discharge instructions were provided to the                            patient.                           - Resume previous diet.                           -  Continue present medications.                           - Await pathology results.                           - Add a daily stool bulking agent such as Metamucil                            or Benefiber                           - Trial of Diltiazem 2% compounded with lidocaine                            5% ointment applied to the rectum 4 times daily                           - Office follow-up in 2-3 weeks, earlier if needed Tressia DanasKimberly Jonuel Butterfield MD, MD 07/03/2021 12:00:58 PM This report has been signed electronically.

## 2021-07-03 NOTE — Progress Notes (Signed)
PT taken to PACU. Monitors in place. VSS. Report given to RN. 

## 2021-07-03 NOTE — Patient Instructions (Addendum)
Pick up prescription for Diltiazem with Lidocaine from [facility] four times daily to rectum   You should apply a pea size amount to your rectum four times daily. Please take your printed prescription to [facility].  [facility]'s information is below:  Address: [address] [place], [place] [address]  [phone]    Await pathology results. See Dr. Orvan Falconer back in office in 2-3 weeks, earlier if needed.    YOU HAD AN ENDOSCOPIC PROCEDURE TODAY AT THE [facility]:   Refer to the procedure report that was given to you for any specific questions about what was found during the examination.  If the procedure report does not answer your questions, please call your gastroenterologist to clarify.  If you requested that your care partner not be given the details of your procedure findings, then the procedure report has been included in a sealed envelope for you to review at your convenience later.  YOU SHOULD EXPECT: Some feelings of bloating in the abdomen. Passage of more gas than usual.  Walking can help get rid of the air that was put into your GI tract during the procedure and reduce the bloating. If you had a lower endoscopy (such as a colonoscopy or flexible sigmoidoscopy) you may notice spotting of blood in your stool or on the toilet paper. If you underwent a bowel prep for your procedure, you may not have a normal bowel movement for a few days.  Please Note:  You might notice some irritation and congestion in your nose or some drainage.  This is from the oxygen used during your procedure.  There is no need for concern and it should clear up in a day or so.  SYMPTOMS TO REPORT IMMEDIATELY:  Following lower endoscopy (colonoscopy or flexible sigmoidoscopy):  Excessive amounts of blood in the stool  Significant tenderness or worsening of abdominal pains  Swelling of the abdomen that is new, acute  Fever of 100F or  higher  For urgent or emergent issues, a gastroenterologist can be reached at any hour by calling [phone]. Do not use MyChart messaging for urgent concerns.    DIET:  We do recommend a small meal at first, but then you may proceed to your regular diet.  Drink plenty of fluids but you should avoid alcoholic beverages for 24 hours.  ACTIVITY:  You should plan to take it easy for the rest of today and you should NOT DRIVE or use heavy machinery until tomorrow (because of the sedation medicines used during the test).    FOLLOW UP: Our staff will call the number listed on your records 48-72 hours following your procedure to check on you and address any questions or concerns that you may have regarding the information given to you following your procedure. If we do not reach you, we will leave a message.  We will attempt to reach you two times.  During this call, we will ask if you have developed any symptoms of COVID 19. If you develop any symptoms (ie: fever, flu-like symptoms, shortness of breath, cough etc.) before then, please call [phone].  If you test positive for Covid 19 in the 2 weeks post procedure, please call and report this information to [place].    If any biopsies were taken you will be contacted by phone or by letter within the next 1-3 weeks.  Please call us at [phone] if you have not heard about  the biopsies in 3 weeks.    SIGNATURES/CONFIDENTIALITY: You and/or your care partner have signed paperwork which will be entered into your electronic medical record.  These signatures attest to the fact that that the information above on your After Visit Summary has been reviewed and is understood.  Full responsibility of the confidentiality of this discharge information lies with you and/or your care-partner.

## 2021-07-03 NOTE — Progress Notes (Signed)
Indication for procedure: Diarrhea  Please see my office visit from 07/02/2021 for complete details.  There is been no change in history or physical exam.  He remains an appropriate candidate for monitored anesthesia care in the endoscopy center.

## 2021-07-06 ENCOUNTER — Telehealth: Payer: Self-pay | Admitting: *Deleted

## 2021-07-06 NOTE — Telephone Encounter (Signed)
Attempted to call patient for their post-procedure follow-up call. No answer. Left voicemail.   

## 2021-07-06 NOTE — Telephone Encounter (Signed)
  Follow up Call-     07/03/2021   10:59 AM  Call back number  Post procedure Call Back phone  # [phone]  Permission to leave phone message Yes     Patient questions:  Do you have a fever, pain , or abdominal swelling? No. Pain Score  0 *  Have you tolerated food without any problems? Yes.    Have you been able to return to your normal activities? Yes.    Do you have any questions about your discharge instructions: Diet   No. Medications  No. Follow up visit  No.  Do you have questions or concerns about your Care? No.  Actions: * If pain score is 4 or above: No action needed, pain <4.

## 2021-07-09 ENCOUNTER — Other Ambulatory Visit: Payer: Self-pay

## 2021-07-09 DIAGNOSIS — K529 Noninfective gastroenteritis and colitis, unspecified: Secondary | ICD-10-CM

## 2021-07-09 MED ORDER — RIFAXIMIN 550 MG PO TABS: 550.0000 mg | ORAL_TABLET | Freq: Three times a day (TID) | ORAL | 0 refills | Status: AC

## 2021-07-29 ENCOUNTER — Ambulatory Visit: Payer: Managed Care, Other (non HMO) | Admitting: Gastroenterology

## 2022-04-28 ENCOUNTER — Other Ambulatory Visit (HOSPITAL_COMMUNITY): Payer: Self-pay

## 2022-05-12 ENCOUNTER — Other Ambulatory Visit (HOSPITAL_COMMUNITY): Payer: Self-pay

## 2022-11-30 ENCOUNTER — Encounter (HOSPITAL_BASED_OUTPATIENT_CLINIC_OR_DEPARTMENT_OTHER): Payer: Self-pay | Admitting: Urology

## 2022-11-30 ENCOUNTER — Emergency Department (HOSPITAL_BASED_OUTPATIENT_CLINIC_OR_DEPARTMENT_OTHER)
Admission: EM | Admit: 2022-11-30 | Discharge: 2022-11-30 | Disposition: A | Discharge status: Home / Self Care | Payer: Managed Care, Other (non HMO) | Attending: Emergency Medicine | Admitting: Emergency Medicine

## 2022-11-30 DIAGNOSIS — H5711 Ocular pain, right eye: Secondary | ICD-10-CM | POA: Diagnosis present

## 2022-11-30 MED ORDER — FLUORESCEIN SODIUM 1 MG OP STRP
1.0000 | ORAL_STRIP | Freq: Once | OPHTHALMIC | Status: AC
  Administered 2022-11-30: 1 via OPHTHALMIC
  Filled 2022-11-30: qty 1

## 2022-11-30 MED ORDER — TETRACAINE HCL 0.5 % OP SOLN
2.0000 [drp] | Freq: Once | OPHTHALMIC | Status: AC
  Administered 2022-11-30: 2 [drp] via OPHTHALMIC
  Filled 2022-11-30: qty 4

## 2022-11-30 NOTE — Discharge Instructions (Signed)
Please call Dr. Eliane Decree office today.  They would like to see you tomorrow.  Return with uncontrolled pain, vision loss, fevers, new or worsening symptoms.

## 2022-11-30 NOTE — ED Provider Notes (Signed)
[facility] Provider Note   CSN: [id number] Arrival date & time: 11/30/22  1050     History  Chief Complaint  Patient presents with   Eye Problem    Ivan Lowe is a 32 y.o. male.  Patient with no previous ocular history, presents with a painful right right eye starting 10 days ago.  Initially the eye was just red.  About 6 days ago he started having a dull aching pain behind the eye.  Pain is worse with extreme lateral gaze deviation.  No drainage.  He denies foreign bodies or injuries.  He does not wear contacts but does have glasses.  No previous eye surgeries.  He states that today it feels like there is a "film" over his eye.  He went to urgent care and was referred to the emergency department.  No fevers.  He has been taking over-the-counter medication with some improvement.  No drainage other than tearing.       Home Medications Prior to Admission medications   Medication Sig Start Date End Date Taking? Authorizing Provider  albuterol (VENTOLIN HFA) 108 (90 Base) MCG/ACT inhaler Inhale into the lungs. Patient not taking: Reported on 07/02/2021 11/08/19   [provider]  AMBULATORY NON FORMULARY MEDICATION Place 1 Dose rectally 4 (four) times daily. Medication Name  Diltiazem 2 % with Lidocaine 5 % per rectum QID 07/03/21   Tressia Danas, MD  fexofenadine (ALLEGRA) 180 MG tablet Take 180 mg by mouth daily. Patient not taking: Reported on 07/02/2021 11/08/19   [provider]  Rimegepant Sulfate (NURTEC) 75 MG TBDP Take 75 mg by mouth daily as needed. Patient not taking: Reported on 07/02/2021 03/25/20   Drema Dallas, DO  topiramate (TOPAMAX) 50 MG tablet Take 1 tablet (50 mg total) by mouth at bedtime. Patient not taking: Reported on 07/02/2021 05/13/20   Drema Dallas, DO      Allergies    Amoxicillin and Flonase [fluticasone propionate]    Review of Systems   Review of Systems  Physical Exam Updated  Vital Signs BP (!) 127/90 (BP Location: Right Arm)   Pulse 89   Temp 98.3 F (36.8 C) (Oral)   Resp 16   Ht 5\' 7"  (1.702 m)   Wt 104.8 kg   SpO2 98%   BMI 36.19 kg/m   Physical Exam Vitals and nursing note reviewed.  Constitutional:      Appearance: He is well-developed.  HENT:     Head: Normocephalic and atraumatic.     Mouth/Throat:     Mouth: Mucous membranes are moist.  Eyes:     General: Lids are normal.     Intraocular pressure: Right eye pressure is 17 mmHg. Left eye pressure is 15 mmHg. Measurements were taken using a handheld tonometer.    Extraocular Movements:     Right eye: Normal extraocular motion.     Left eye: Normal extraocular motion.     Conjunctiva/sclera:     Right eye: Right conjunctiva is injected. No chemosis.    Pupils:     Right eye: Pupil is not round and sluggish. Pupil is reactive. No corneal abrasion or fluorescein uptake.     Left eye: Pupil is round, reactive and not sluggish.     Slit lamp exam:    Right eye: Photophobia present. No corneal ulcer.     Comments: No pain in right with light shone into left  Pulmonary:  Effort: No respiratory distress.  Musculoskeletal:     Cervical back: Normal range of motion and neck supple.  Skin:    General: Skin is warm and dry.  Neurological:     Mental Status: He is alert.     ED Results / Procedures / Treatments   Labs (all labs ordered are listed, but only abnormal results are displayed) Labs Reviewed - No data to display  EKG None  Radiology No results found.  Procedures Procedures    Medications Ordered in ED Medications  fluorescein ophthalmic strip 1 strip (1 strip Right Eye Given by Other 11/30/22 1206)  tetracaine (PONTOCAINE) 0.5 % ophthalmic solution 2 drop (2 drops Right Eye Given by Other 11/30/22 1206)    ED Course/ Medical Decision Making/ A&P    Patient seen and examined. History obtained directly from patient.   Labs/EKG: None ordered.  Imaging: None  ordered.   Medications/Fluids: Ordered: tetracaine, fluorescein  Most recent vital signs reviewed and are as follows: BP (!) 127/90 (BP Location: Right Arm)   Pulse 89   Temp 98.3 F (36.8 C) (Oral)   Resp 16   Ht 5\' 7"  (1.702 m)   Wt 104.8 kg   SpO2 98%   BMI 36.19 kg/m   Initial impression: Painful R red eye, concern for uveitis  12:59 PM Reassessment performed. Patient appears stable.  I have spoken with Dr. Allena Katz, ophthalmology.  Recommends patient call the office for appointment tomorrow.  No new treatments at the current time.  Plan discussed with patient and family member at bedside.  Questions answered.   Most current vital signs reviewed and are as follows: BP (!) 127/90 (BP Location: Right Arm)   Pulse 89   Temp 98.3 F (36.8 C) (Oral)   Resp 16   Ht 5\' 7"  (1.702 m)   Wt 104.8 kg   SpO2 98%   BMI 36.19 kg/m   Plan: Discharge to home.   Prescriptions written for: None  Other home care instructions discussed: Continued anti-inflammatories, cool compress  ED return instructions discussed: Vision loss, fever, new or worsening symptoms  Follow-up instructions discussed: Patient encouraged to follow-up with ophthalmology tomorrow as planned.                                  Medical Decision Making Risk Prescription drug management.   Patient with painful red right eye.  No foreign bodies noted. No surrounding erythema, swelling, vision changes/loss suspicious for orbital or periorbital cellulitis. No signs of glaucoma, intraocular pressures normal. No symptoms of retinal detachment.  Patient will require ophthalmologic evaluation, discussed with on-call provider today, plan for follow-up in the office tomorrow.        Final Clinical Impression(s) / ED Diagnoses Final diagnoses:  Acute right eye pain    Rx / DC Orders ED Discharge Orders     None         Renne Crigler, PA-C 11/30/22 1300    Virgina Norfolk, DO 11/30/22 1413

## 2022-11-30 NOTE — ED Notes (Signed)
D/c paperwork reviewed with pt, including follow up care.  All questions and/or concerns addressed at time of d/c.  No further needs expressed. . Pt verbalized understanding, Ambulatory with family to ED exit, NAD.

## 2022-11-30 NOTE — ED Triage Notes (Signed)
Pt states right eye redness and drainage x 11 days with associated swellling, no injury noted  States starting to lose vision  Sent from [facility]  Does not have eye dr.
